# Patient Record
Sex: Female | Born: 1975 | Race: White | Hispanic: No | Smoking: Never smoker
Health system: Southern US, Community
[De-identification: ages and names within clinical notes are randomized; demographics above are authoritative.]

## PROBLEM LIST (undated history)

## (undated) DIAGNOSIS — G43909 Migraine, unspecified, not intractable, without status migrainosus: Secondary | ICD-10-CM

## (undated) DIAGNOSIS — F329 Major depressive disorder, single episode, unspecified: Secondary | ICD-10-CM

## (undated) DIAGNOSIS — K219 Gastro-esophageal reflux disease without esophagitis: Secondary | ICD-10-CM

## (undated) DIAGNOSIS — Z8041 Family history of malignant neoplasm of ovary: Secondary | ICD-10-CM

## (undated) DIAGNOSIS — E785 Hyperlipidemia, unspecified: Secondary | ICD-10-CM

## (undated) DIAGNOSIS — F419 Anxiety disorder, unspecified: Secondary | ICD-10-CM

## (undated) DIAGNOSIS — F32A Depression, unspecified: Secondary | ICD-10-CM

## (undated) DIAGNOSIS — R87612 Low grade squamous intraepithelial lesion on cytologic smear of cervix (LGSIL): Secondary | ICD-10-CM

## (undated) DIAGNOSIS — K469 Unspecified abdominal hernia without obstruction or gangrene: Secondary | ICD-10-CM

## (undated) DIAGNOSIS — K5900 Constipation, unspecified: Secondary | ICD-10-CM

## (undated) DIAGNOSIS — Z1379 Encounter for other screening for genetic and chromosomal anomalies: Secondary | ICD-10-CM

## (undated) DIAGNOSIS — K449 Diaphragmatic hernia without obstruction or gangrene: Secondary | ICD-10-CM

## (undated) DIAGNOSIS — Z9289 Personal history of other medical treatment: Secondary | ICD-10-CM

## (undated) HISTORY — DX: Family history of malignant neoplasm of ovary: Z80.41

## (undated) HISTORY — DX: Major depressive disorder, single episode, unspecified: F32.9

## (undated) HISTORY — PX: TONSILLECTOMY AND ADENOIDECTOMY: SUR1326

## (undated) HISTORY — DX: Depression, unspecified: F32.A

## (undated) HISTORY — DX: Unspecified abdominal hernia without obstruction or gangrene: K46.9

## (undated) HISTORY — PX: TUBAL LIGATION: SHX77

## (undated) HISTORY — DX: Hyperlipidemia, unspecified: E78.5

## (undated) HISTORY — DX: Constipation, unspecified: K59.00

## (undated) HISTORY — DX: Encounter for other screening for genetic and chromosomal anomalies: Z13.79

## (undated) HISTORY — DX: Personal history of other medical treatment: Z92.89

## (undated) HISTORY — DX: Migraine, unspecified, not intractable, without status migrainosus: G43.909

## (undated) HISTORY — DX: Gastro-esophageal reflux disease without esophagitis: K21.9

## (undated) HISTORY — PX: KNEE ARTHROSCOPY: SUR90

## (undated) HISTORY — DX: Diaphragmatic hernia without obstruction or gangrene: K44.9

---
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
## 2002-06-03 HISTORY — PX: COLPOSCOPY: SHX161

## 2005-01-20 ENCOUNTER — Emergency Department: Payer: Self-pay | Admitting: Internal Medicine

## 2005-01-22 ENCOUNTER — Ambulatory Visit: Payer: Self-pay | Admitting: Obstetrics & Gynecology

## 2006-03-08 ENCOUNTER — Inpatient Hospital Stay: Payer: Self-pay

## 2007-09-29 ENCOUNTER — Ambulatory Visit: Payer: Self-pay | Admitting: Internal Medicine

## 2008-07-12 ENCOUNTER — Ambulatory Visit: Payer: Self-pay | Admitting: Internal Medicine

## 2009-08-27 ENCOUNTER — Ambulatory Visit: Payer: Self-pay | Admitting: Internal Medicine

## 2010-07-17 ENCOUNTER — Ambulatory Visit: Payer: Self-pay | Admitting: Internal Medicine

## 2011-10-29 ENCOUNTER — Observation Stay: Payer: Self-pay

## 2011-11-05 ENCOUNTER — Observation Stay: Payer: Self-pay | Admitting: Obstetrics & Gynecology

## 2011-11-12 ENCOUNTER — Inpatient Hospital Stay: Payer: Self-pay | Admitting: Obstetrics & Gynecology

## 2011-11-12 LAB — CBC WITH DIFFERENTIAL/PLATELET
Basophil #: 0 10*3/uL (ref 0.0–0.1)
HGB: 10.8 g/dL — ABNORMAL LOW (ref 12.0–16.0)
Lymphocyte %: 10.1 %
MCH: 30.1 pg (ref 26.0–34.0)
MCHC: 34.4 g/dL (ref 32.0–36.0)
Monocyte #: 0.7 x10 3/mm (ref 0.2–0.9)
Neutrophil #: 8.6 10*3/uL — ABNORMAL HIGH (ref 1.4–6.5)
Neutrophil %: 82.5 %
Platelet: 189 10*3/uL (ref 150–440)
RBC: 3.57 10*6/uL — ABNORMAL LOW (ref 3.80–5.20)
WBC: 10.4 10*3/uL (ref 3.6–11.0)

## 2011-11-14 LAB — PATHOLOGY REPORT

## 2012-04-21 DIAGNOSIS — Z8041 Family history of malignant neoplasm of ovary: Secondary | ICD-10-CM

## 2012-04-21 DIAGNOSIS — E785 Hyperlipidemia, unspecified: Secondary | ICD-10-CM

## 2012-04-21 DIAGNOSIS — Z9289 Personal history of other medical treatment: Secondary | ICD-10-CM

## 2012-04-21 HISTORY — DX: Family history of malignant neoplasm of ovary: Z80.41

## 2012-04-21 HISTORY — DX: Personal history of other medical treatment: Z92.89

## 2012-04-21 HISTORY — DX: Hyperlipidemia, unspecified: E78.5

## 2012-09-10 ENCOUNTER — Ambulatory Visit: Payer: Self-pay

## 2013-02-22 ENCOUNTER — Emergency Department: Payer: Self-pay | Admitting: Emergency Medicine

## 2013-02-22 LAB — URINALYSIS, COMPLETE
Bacteria: NONE SEEN
Glucose,UR: NEGATIVE mg/dL (ref 0–75)
Hyaline Cast: 9
Nitrite: NEGATIVE
Protein: NEGATIVE
RBC,UR: <1 /HPF (ref 0–5)
Specific Gravity: 1.015 (ref 1.003–1.030)
Squamous Epithelial: 1

## 2013-02-22 LAB — CBC
HCT: 41.2 % (ref 35.0–47.0)
MCH: 28.2 pg (ref 26.0–34.0)
MCHC: 33.6 g/dL (ref 32.0–36.0)
MCV: 84 fL (ref 80–100)
Platelet: 303 10*3/uL (ref 150–440)
RBC: 4.9 10*6/uL (ref 3.80–5.20)
RDW: 13.9 % (ref 11.5–14.5)

## 2013-02-22 LAB — COMPREHENSIVE METABOLIC PANEL
Alkaline Phosphatase: 79 U/L (ref 50–136)
Anion Gap: 9 (ref 7–16)
Bilirubin,Total: 0.2 mg/dL (ref 0.2–1.0)
Calcium, Total: 7.9 mg/dL — ABNORMAL LOW (ref 8.5–10.1)
Chloride: 108 mmol/L — ABNORMAL HIGH (ref 98–107)
Co2: 20 mmol/L — ABNORMAL LOW (ref 21–32)
Creatinine: 0.88 mg/dL (ref 0.60–1.30)
EGFR (African American): >60
SGPT (ALT): 22 U/L (ref 12–78)
Sodium: 137 mmol/L (ref 136–145)
Total Protein: 6.6 g/dL (ref 6.4–8.2)

## 2013-02-22 LAB — TROPONIN I: Troponin-I: <0.02 ng/mL

## 2013-03-12 ENCOUNTER — Ambulatory Visit: Payer: Self-pay | Admitting: Podiatry

## 2013-05-05 ENCOUNTER — Ambulatory Visit: Payer: Self-pay | Admitting: Obstetrics and Gynecology

## 2013-05-05 LAB — BASIC METABOLIC PANEL
Anion Gap: 8 (ref 7–16)
BUN: 9 mg/dL (ref 7–18)
Calcium, Total: 8.9 mg/dL (ref 8.5–10.1)
Creatinine: 0.59 mg/dL — ABNORMAL LOW (ref 0.60–1.30)
EGFR (African American): >60
EGFR (Non-African Amer.): >60
Osmolality: 269 (ref 275–301)

## 2013-05-05 LAB — CBC
MCH: 27.9 pg (ref 26.0–34.0)
MCHC: 33.5 g/dL (ref 32.0–36.0)
MCV: 83 fL (ref 80–100)
Platelet: 319 10*3/uL (ref 150–440)
RDW: 13.3 % (ref 11.5–14.5)
WBC: 9.7 10*3/uL (ref 3.6–11.0)

## 2013-05-13 ENCOUNTER — Ambulatory Visit: Payer: Self-pay | Admitting: Obstetrics & Gynecology

## 2013-05-14 HISTORY — PX: ABDOMINAL HYSTERECTOMY: SHX81

## 2013-05-14 LAB — CBC WITH DIFFERENTIAL/PLATELET
Basophil %: 0.2 %
Eosinophil #: 0.1 10*3/uL (ref 0.0–0.7)
HCT: 30 % — ABNORMAL LOW (ref 35.0–47.0)
Lymphocyte #: 1.2 10*3/uL (ref 1.0–3.6)
MCH: 27.7 pg (ref 26.0–34.0)
MCHC: 33.2 g/dL (ref 32.0–36.0)
Monocyte %: 6.8 %
Neutrophil #: 7.8 10*3/uL — ABNORMAL HIGH (ref 1.4–6.5)
Neutrophil %: 80.1 %
Platelet: 243 10*3/uL (ref 150–440)
RBC: 3.61 10*6/uL — ABNORMAL LOW (ref 3.80–5.20)

## 2013-05-14 LAB — BASIC METABOLIC PANEL
Calcium, Total: 8 mg/dL — ABNORMAL LOW (ref 8.5–10.1)
Chloride: 106 mmol/L (ref 98–107)
Creatinine: 0.71 mg/dL (ref 0.60–1.30)
EGFR (African American): >60
Glucose: 114 mg/dL — ABNORMAL HIGH (ref 65–99)
Osmolality: 270 (ref 275–301)
Potassium: 3.4 mmol/L — ABNORMAL LOW (ref 3.5–5.1)
Sodium: 136 mmol/L (ref 136–145)

## 2013-12-13 ENCOUNTER — Ambulatory Visit: Payer: Self-pay | Admitting: Physician Assistant

## 2014-03-21 ENCOUNTER — Ambulatory Visit: Payer: Self-pay | Admitting: Physician Assistant

## 2014-03-21 LAB — URINALYSIS, COMPLETE
Bilirubin,UR: NEGATIVE
Blood: NEGATIVE
Glucose,UR: NEGATIVE
Ketone: NEGATIVE
Leukocyte Esterase: NEGATIVE
Nitrite: NEGATIVE
PH: 5.5 (ref 5.0–8.0)
Protein: NEGATIVE
Specific Gravity: 1.02 (ref 1.000–1.030)

## 2014-03-23 LAB — URINE CULTURE

## 2014-06-27 ENCOUNTER — Ambulatory Visit: Payer: Self-pay | Admitting: Family Medicine

## 2014-07-08 ENCOUNTER — Ambulatory Visit: Payer: Self-pay | Admitting: Registered Nurse

## 2014-07-08 LAB — RAPID STREP-A WITH REFLX: MICRO TEXT REPORT: NEGATIVE

## 2014-07-11 LAB — BETA STREP CULTURE(ARMC)

## 2014-07-20 ENCOUNTER — Ambulatory Visit: Payer: Self-pay | Admitting: Family Medicine

## 2014-08-10 ENCOUNTER — Ambulatory Visit: Payer: Self-pay | Admitting: Family Medicine

## 2014-08-12 ENCOUNTER — Ambulatory Visit: Payer: Self-pay | Admitting: Family Medicine

## 2014-08-15 DIAGNOSIS — K219 Gastro-esophageal reflux disease without esophagitis: Secondary | ICD-10-CM

## 2014-08-15 DIAGNOSIS — K449 Diaphragmatic hernia without obstruction or gangrene: Secondary | ICD-10-CM

## 2014-08-15 DIAGNOSIS — K5900 Constipation, unspecified: Secondary | ICD-10-CM

## 2014-08-15 HISTORY — DX: Diaphragmatic hernia without obstruction or gangrene: K44.9

## 2014-08-15 HISTORY — DX: Constipation, unspecified: K59.00

## 2014-08-15 HISTORY — DX: Gastro-esophageal reflux disease without esophagitis: K21.9

## 2014-09-23 NOTE — Op Note (Signed)
PATIENT NAME:  Kim Schmidt, BUELNA MR#:  [id number] DATE OF BIRTH:  1975-08-17  DATE OF PROCEDURE:  05/14/2013  PREOPERATIVE DIAGNOSIS:  Menorrhagia and severe dysmenorrhea failing medical management.   POSTOPERATIVE DIAGNOSIS:  Menorrhagia and severe dysmenorrhea failing medical management with probable endometriosis as well as a small mucinous excrescence noted on the right ovary.   OPERATION PERFORMED:  Total laparoscopic hysterectomy, left salpingectomy and right salpingo-oophorectomy as well as cystoscopy.   ANESTHESIA USED:  General.   PRIMARY SURGEON:  Stoney Bang. Georgianne Fick, MD.  ASSISTANT:  Prentice Docker, MD.  PREOPERATIVE ANTIBIOTICS:  Two grams of Ancef.  DRAINS OR TUBES:  Foley to gravity drainage.   IMPLANTS:  None.   ESTIMATED BLOOD LOSS:  150 mL.   OPERATIVE FLUIDS: 1200 mL.   URINE OUTPUT:  481 mL   COMPLICATIONS:  None.   INTRAOPERATIVE FINDINGS:  Globular, slightly enlarged uterus with a normal left tube and the ovary. The right tube appeared normal. The right ovary on closer inspection had several small, clear mucinous blebs or excrescences. Some small endometriotic implants were noted in the anterior cul-de-sac on the bladder. Cystoscopy at the conclusion of the case revealed intact bladder dome and bilateral efflux of urine from both ureters.   SPECIMENS REMOVED:  Uterus and cervix, right and left tube as well as right ovary.   PATIENT'S CONDITION FOLLOWING THE PROCEDURE:  Stable.   PROCEDURE IN DETAIL:  The risks, benefits and alternatives of the procedure were discussed with the patient prior to proceeding to the operating room. The patient was taken to the operating room where she was placed under general endotracheal anesthesia. She was positioned in the dorsal lithotomy position utilizing Allen stirrups. A time-out procedure was performed. Attention was turned to the patient's pelvis. A Foley catheter was placed. The operative speculum was then used to  visualize the anterior lip of the cervix, which was grasped with a single-tooth tenaculum. The uterus was sounded to a 10 cm. A large V-Care device was then placed, and the single-tooth tenaculum and an operative speculum were removed. Once the cup of the V-Care device was seated properly, attention was turned to the patient's abdomen. The umbilicus was infiltrated with 0.5% Sensorcaine. A stab incision was made at the base of the umbilicus, and a 5-mm XL trocar was utilized to gain entry into the peritoneal cavity under direct visualization. Pneumoperitoneum was established and 2 lateral 5-mm assistant ports were also placed under direct visualization. Following this, the general survey was conducted with normal findings as noted above other than the right ovary. The left adnexal region was inspected and the left fallopian tube was grasped and then transected off its attachment using a 5-mm LigaSure device. The utero-ovarian ligament was transected also using the 5-mm LigaSure, as was the round ligament. Once these pedicles had been transected, the anterior leaf of the broad was dissected to the level of the internal cervical os, and the bladder flap was created. The posterior leaf of the broad was likewise dissected, and the uterine artery was then skeletonized before being cauterized using a bipolar cautery. The artery was then transected using the 5-mm LigaSure device. There was some bleeding from the left uterine artery, which had to be controlled using the cautery following transection. Attention was then turned to the patient's right adnexa. 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 The ovary was therefore transected from the IP pedicle. The ureter was visualized  well away from the area of dissection. There was also some increased vascularity noted to the right ovary as opposed to the left. The ovary and fallopian tube  were dissected off the mesosalpinx. The round ligament was then incised. The anterior leaf of the broad was taken down to the level of the internal cervical os meeting the previously created bladder flap. The bladder was further mobilized off the specimen. The posterior leaf of the broad was dissected and the uterine artery was skeletonized before being cauterized with the cautery and transected similar to the left uterine artery. Attention was then turned again to the patient's left. The bladder was noted to be sufficiently off the cervix, and the V-Care device was clearly visualized through the vaginal fornix. A colpotomy incision was made and carried around circumferentially, staying within the cup of the V-Care device. Following this, the specimen was freed from all attachments and removed through the patient's vagina. The pedicles were inspected and noted to be hemostatic. The vaginal cuff was then closed vaginally using interrupted figure-of-eight's of 0 Vicryl. Following closure of the vaginal cuff, cystoscopy was performed noting bilateral efflux of urine from both ureters and an intact bladder dome. Attention was once again turned to the patient's abdomen. The pedicles were inspected one last time. The pelvis was irrigated. All pedicles were noted to be hemostatic and 1 gram of Arista powder was applied to the vaginal cuff and all pedicles. The pneumoperitoneum was evacuated. The trocars were removed. The trocar sites were dressed with Dermabond. Sponge and instrument counts were correct x 2. The patient tolerated the procedure well and was taken to the recovery room in stable condition.   ____________________________ Stoney Bang. Georgianne Fick, MD ams:jm D: 05/15/2013 10:46:36 ET T: 05/15/2013 11:24:52 ET JOB#: [id number]  cc: Stoney Bang. Georgianne Fick, MD, <Dictator> Dorthula Nettles MD ELECTRONICALLY SIGNED 05/23/2013 19:04

## 2014-09-25 NOTE — Op Note (Signed)
PATIENT NAME:  Kim Schmidt, Kim Schmidt MR#:  [id number] DATE OF BIRTH:  May 15, 1976  DATE OF PROCEDURE:  11/12/2011  PREOPERATIVE DIAGNOSIS: Intrauterine pregnancy, [redacted] weeks gestation, labor, breech and transverse lie presentation.  Desires sterilization.  POSTOPERATIVE DIAGNOSIS:  Intrauterine pregnancy, [redacted] weeks gestation, labor, breech and transverse lie presentation.  Desires sterilization.  PROCEDURE PERFORMED: Low transverse cesarean section, bilateral partial salpingectomy.   SURGEON: Wonda Cheng. Laurey Morale, M.D.   FIRST ASSISTANT: Debroah Loop   NEONATOLOGIST:    OPERATIVE FINDINGS: Baby A delivered at 11:33 a.m. Female- Terrence Dupont. 5 pounds 13 ounces, Apgars 9 and 9. Baby B delivered at 11:34 a.m. Female infant- Kayle, Apgars 9 and 9, weighing 5 pounds 1 ounce.    OPERATION:  After adequate conduction anesthesia, the patient was prepped and draped in routine fashion. A skin incision in modified Pfannenstiel fashion was made and carried down the various layers and the peritoneal cavity was entered. Bladder flap was created and the bladder was pushed down. A low transverse incision was made and the first baby as a frank breech presentation was delivered without difficulty. By the time we turned our attention to the second baby, it was now in vertex presentation. This baby was likewise delivered without difficulty. The placenta was removed manually. Cervix was "cracked". The uterus was then closed in a continuous lock suture of chromic one. All areas of surgery were inspected and found to be hemostatic. A bilateral partial salpingectomy was then performed with both tubes being doubly ligated with plain suture and a portion removed. The pelvis was lavaged with copious amounts of saline. Interceed was placed. Rectus muscles were reapproximated in the midline. The On-Q pump was placed. The fascia was reapproximated with continuous suture of Maxon. Several triple 0 plain sutures were placed in the fat and the skin  was closed with skin staples. Estimated blood loss was 800 mL.    The patient tolerated the procedure well and left the operating room in good condition. Sponge and needle counts were said to be correct at the end of the procedure.    ____________________________ Wonda Cheng. Laurey Morale, MD pjr:bjt D: 11/12/2011 12:12:42 ET T: 11/12/2011 12:43:10 ET JOB#: [id number]  cc: Wonda Cheng. Laurey Morale, MD, <Dictator> Rosina Lowenstein MD ELECTRONICALLY SIGNED 11/20/2011 6:11

## 2014-10-11 NOTE — H&P (Signed)
L&D Evaluation:  History:   HPI 39 year old G3 P73 with di/di  and EDC=12/09/2011 by LMP=03/04/2011 presented at 39 1/7 weeks for a NST with c/o contractions since 1 AM this Am that have gotten preogressively more uncomfortable. She had brown mucoid discharge x 3 days. Dates confirmed with first trimeester ultrasound. There has been normal growth of the twins, but they have had unstable lies. Currently Twin A is breech and twin B is transverse. prenatal care is also remarkable for AMA, migraines, anxiety, obesity, acute bronchitisand anemia. There was some mild renal pelvis dilation on Twin A which is WNL. Labs: A POS, VI, RI,    Presents with contractions, and for a NST    Patient's Medical History Migraines, anxiety    Patient's Surgical History arthroscopic knee surgery; T&A    Medications Pre Natal Vitamins  Zoloft 50 mgm daily    Allergies Sulfa    Social History none    Family History Non-Contributory   ROS:   ROS see HPI   Exam:   Vital Signs stable    Urine Protein not completed    General breathing thru some contractions    Mental Status clear    Chest clear    Heart normal sinus rhythm, no murmur/gallop/rubs    Abdomen gravid, tender with contractions    Fetal Position breech/transverse    Edema trace edema    Reflexes 1+    Pelvic no external lesions, 5-6/60%/OOP    Mebranes Intact    FHT normal rate with no decels, Both FHR patterns are reactive    FHT Description moderate variability    Ucx q6-7 min    Skin dry   Impression:   Impression Twin IUP in labor with breech/transverse presentation   Plan:   Plan Prepare for C-section. Dr Laurey Morale notified.   Electronic Signatures: Kim Schmidt (CNM)  (Signed 11-Jun-13 11:54)  Authored: L&D Evaluation   Last Updated: 11-Jun-13 11:54 by Kim Schmidt (CNM)

## 2015-10-04 ENCOUNTER — Ambulatory Visit
Admission: EM | Admit: 2015-10-04 | Discharge: 2015-10-04 | Disposition: A | Discharge status: Home / Self Care | Payer: BLUE CROSS/BLUE SHIELD | Attending: Family Medicine | Admitting: Family Medicine

## 2015-10-04 DIAGNOSIS — N390 Urinary tract infection, site not specified: Secondary | ICD-10-CM | POA: Diagnosis not present

## 2015-10-04 HISTORY — DX: Anxiety disorder, unspecified: F41.9

## 2015-10-04 LAB — URINALYSIS COMPLETE WITH MICROSCOPIC (ARMC ONLY)
Bilirubin Urine: NEGATIVE
Glucose, UA: NEGATIVE mg/dL
KETONES UR: NEGATIVE mg/dL
Nitrite: NEGATIVE
PH: 6 (ref 5.0–8.0)
PROTEIN: 30 mg/dL — AB
SPECIFIC GRAVITY, URINE: 1.02 (ref 1.005–1.030)

## 2015-10-04 MED ORDER — SULFAMETHOXAZOLE-TRIMETHOPRIM 800-160 MG PO TABS: 1.0000 | ORAL_TABLET | Freq: Two times a day (BID) | ORAL | Status: DC

## 2015-10-04 MED ORDER — PHENAZOPYRIDINE HCL 200 MG PO TABS: 200.0000 mg | ORAL_TABLET | Freq: Three times a day (TID) | ORAL | Status: DC

## 2015-10-04 NOTE — ED Provider Notes (Signed)
CSN: [id number]     Arrival date & time 10/04/15  1540 History   First MD Initiated Contact with Patient 10/04/15 1622     Chief Complaint  Patient presents with  . Urinary Frequency    Pt started yesterday with UTI sx of burning with urination, frequency, blood in urine and pelvic discomfort 3/10   (Consider location/radiation/quality/duration/timing/severity/associated sxs/prior Treatment) HPI Comments: Married caucasian female with burning, frequency and gross hematuria that started yesterday.  Last UTI one year ago was initially on cipro and had to take bactrim as resistant to cipro.  Denied side effects to either antibiotic.  +Headache, pelvic pain had PSHx c-section, hysterectomy no menses for years or vaginal bleeding denied new sexual partners.    The history is provided by the patient.    Past Medical History  Diagnosis Date  . Anxiety    Past Surgical History  Procedure Laterality Date  . Cesarean section    . Abdominal hysterectomy     History reviewed. No pertinent family history. Social History  Substance Use Topics  . Smoking status: Never Smoker   . Smokeless tobacco: None  . Alcohol Use: No   OB History    No data available     Review of Systems  Constitutional: Negative for fever, chills, diaphoresis, activity change, appetite change, fatigue and unexpected weight change.  HENT: Negative for congestion, dental problem, drooling, ear discharge, ear pain, facial swelling, hearing loss, mouth sores, nosebleeds, postnasal drip, rhinorrhea, sinus pressure, sneezing, sore throat, tinnitus, trouble swallowing and voice change.   Eyes: Negative for photophobia, pain, discharge, redness, itching and visual disturbance.  Respiratory: Negative for cough, choking, chest tightness, shortness of breath, wheezing and stridor.   Cardiovascular: Negative for chest pain, palpitations and leg swelling.  Gastrointestinal: Negative for nausea, vomiting, abdominal pain, diarrhea,  constipation, blood in stool and abdominal distention.  Endocrine: Negative for cold intolerance and heat intolerance.  Genitourinary: Positive for dysuria, urgency, frequency, hematuria and pelvic pain. Negative for flank pain, decreased urine volume, vaginal bleeding, enuresis, difficulty urinating, genital sores and menstrual problem.  Musculoskeletal: Negative for myalgias, back pain, joint swelling, arthralgias, gait problem, neck pain and neck stiffness.  Skin: Negative for color change, pallor, rash and wound.  Allergic/Immunologic: Negative for environmental allergies and food allergies.  Neurological: Positive for headaches. Negative for dizziness, tremors, seizures, syncope, facial asymmetry, speech difficulty, weakness, light-headedness and numbness.  Hematological: Negative for adenopathy. Does not bruise/bleed easily.  Psychiatric/Behavioral: Negative for behavioral problems, confusion, sleep disturbance and agitation.    Allergies  Review of patient's allergies indicates no known allergies.  Home Medications   Prior to Admission medications   Medication Sig Start Date End Date Taking? Authorizing Provider  sertraline (ZOLOFT) 50 MG tablet Take 50 mg by mouth daily.   Yes Historical Provider, MD  phenazopyridine (PYRIDIUM) 200 MG tablet Take 1 tablet (200 mg total) by mouth 3 (three) times daily. 10/04/15   Olen Cordial, NP  sulfamethoxazole-trimethoprim (BACTRIM DS,SEPTRA DS) 800-160 MG tablet Take 1 tablet by mouth 2 (two) times daily. 10/04/15   Olen Cordial, NP   Meds Ordered and Administered this Visit  Medications - No data to display  BP 122/67 mmHg  Pulse 83  Temp(Src) 98.5 F (36.9 C) (Oral)  Resp 18  Ht 5\' 4"  (1.626 m)  Wt 230 lb (104.327 kg)  BMI 39.46 kg/m2  SpO2 100% No data found.   Physical Exam  Constitutional: She is oriented to person, place, and time.  She appears well-developed and well-nourished. She is active and cooperative.  Non-toxic  appearance. She does not have a sickly appearance. She does not appear ill. No distress.  HENT:  Head: Normocephalic and atraumatic.  Right Ear: Hearing, tympanic membrane, external ear and ear canal normal.  Left Ear: Hearing, tympanic membrane, external ear and ear canal normal.  Nose: No mucosal edema, rhinorrhea, nose lacerations, sinus tenderness, nasal deformity, septal deviation or nasal septal hematoma. No epistaxis.  No foreign bodies. Right sinus exhibits no maxillary sinus tenderness and no frontal sinus tenderness. Left sinus exhibits no maxillary sinus tenderness and no frontal sinus tenderness.  Mouth/Throat: Uvula is midline and mucous membranes are normal. Mucous membranes are not pale, not dry and not cyanotic. She does not have dentures. No oral lesions. No trismus in the jaw. Normal dentition. No dental abscesses, uvula swelling, lacerations or dental caries. No oropharyngeal exudate, posterior oropharyngeal edema, posterior oropharyngeal erythema or tonsillar abscesses.  Eyes: Conjunctivae, EOM and lids are normal. Pupils are equal, round, and reactive to light. Right eye exhibits no chemosis, no discharge, no exudate and no hordeolum. No foreign body present in the right eye. Left eye exhibits no chemosis, no discharge, no exudate and no hordeolum. No foreign body present in the left eye. Right conjunctiva is not injected. Right conjunctiva has no hemorrhage. Left conjunctiva is not injected. Left conjunctiva has no hemorrhage. No scleral icterus. Right eye exhibits normal extraocular motion and no nystagmus. Left eye exhibits normal extraocular motion and no nystagmus. Right pupil is round and reactive. Left pupil is round and reactive. Pupils are equal.  Neck: Trachea normal and normal range of motion. Neck supple. No tracheal tenderness, no spinous process tenderness and no muscular tenderness present. No rigidity. No tracheal deviation, no edema, no erythema and normal range of  motion present. No thyroid mass and no thyromegaly present.  Cardiovascular: Normal rate, regular rhythm, S1 normal, S2 normal, normal heart sounds and intact distal pulses.  PMI is not displaced.  Exam reveals no gallop and no friction rub.   No murmur heard. Pulmonary/Chest: Effort normal and breath sounds normal. No accessory muscle usage or stridor. No respiratory distress. She has no decreased breath sounds. She has no wheezes. She has no rhonchi. She has no rales. She exhibits no tenderness.  Abdominal: Soft. Bowel sounds are normal. She exhibits no shifting dullness, no distension, no pulsatile liver, no fluid wave, no abdominal bruit, no ascites, no pulsatile midline mass and no mass. There is no hepatosplenomegaly. There is no tenderness. There is no rigidity, no rebound, no guarding, no CVA tenderness, no tenderness at McBurney's point and negative Murphy's sign. Hernia confirmed negative in the ventral area.  Dull to percussion x 4 quads  Musculoskeletal: Normal range of motion. She exhibits no edema or tenderness.       Right shoulder: Normal.       Left shoulder: Normal.       Right hip: Normal.       Left hip: Normal.       Right knee: Normal.       Left knee: Normal.       Cervical back: Normal.       Right hand: Normal.       Left hand: Normal.  Lymphadenopathy:       Head (right side): No submental, no submandibular, no tonsillar, no preauricular, no posterior auricular and no occipital adenopathy present.       Head (left side): No submental,  no submandibular, no tonsillar, no preauricular, no posterior auricular and no occipital adenopathy present.    She has no cervical adenopathy.       Right cervical: No superficial cervical, no deep cervical and no posterior cervical adenopathy present.      Left cervical: No superficial cervical, no deep cervical and no posterior cervical adenopathy present.  Neurological: She is alert and oriented to person, place, and time. She has  normal strength. She is not disoriented. She displays no atrophy and no tremor. No cranial nerve deficit or sensory deficit. She exhibits normal muscle tone. She displays no seizure activity. Coordination and gait normal. GCS eye subscore is 4. GCS verbal subscore is 5. GCS motor subscore is 6.  Skin: Skin is warm, dry and intact. No abrasion, no bruising, no burn, no ecchymosis, no laceration, no lesion, no petechiae and no rash noted. She is not diaphoretic. No cyanosis or erythema. No pallor. Nails show no clubbing.  Psychiatric: She has a normal mood and affect. Her speech is normal and behavior is normal. Judgment and thought content normal. Cognition and memory are normal.  Nursing note and vitals reviewed.   ED Course  Procedures (including critical care time)  Labs Review Labs Reviewed  URINALYSIS COMPLETEWITH MICROSCOPIC ([facility] ONLY) - Abnormal; Notable for the following:    APPearance CLOUDY (*)    Hgb urine dipstick 2+ (*)    Protein, ur 30 (*)    Leukocytes, UA 1+ (*)    Bacteria, UA FEW (*)    Squamous Epithelial / LPF 0-5 (*)    All other components within normal limits  URINE CULTURE    Imaging Review No results found.  1630 discussed urinalysis results with patient + protein, blood, bacteria, cloudy copy of urinalysis results given to patient and instructed to follow up with PCM in 3 weeks for repeat urinalysis to ensure blood and protein resolved with UTI treatment.  Patient verbalized understanding of information/instructions, agreed with plan of care and had no further questions at this time.  MDM   1. UTI (lower urinary tract infection)    Medications as directed.  Patient reported UTI one year ago resistant to cipro and had to take bactrim to clear infection.  Patient denied side effects to bactrim at that time.  Bactrim DS po BID x 7 days Rx given. Patient is also to push fluids and may use Pyridium 200mg  po TID as needed x 2 days discussed with turn urine orange  and stain clothes.  Hydrate, avoid dehydration.  Avoid holding urine void on frequent basis every 4 to 6 hours.  If unable to void every 8 hours, worsening hematuria follow up for re-evaluation with PCM, urgent care or ER.   Call or return to clinic as needed if these symptoms worsen or fail to improve as anticipated.  Exitcare handout on UTI given to patient Patient verbalized agreement and understanding of treatment plan and had no further questions at this time. P2:  Hydrate and cranberry juice    Olen Cordial, NP 10/04/15 2124

## 2015-10-04 NOTE — Discharge Instructions (Signed)
Hematuria, Adult °Hematuria is blood in your urine. It can be caused by a bladder infection, kidney infection, prostate infection, kidney stone, or cancer of your urinary tract. Infections can usually be treated with medicine, and a kidney stone usually will pass through your urine. If neither of these is the cause of your hematuria, further workup to find out the reason may be needed. °It is very important that you tell your health care provider about any blood you see in your urine, even if the blood stops without treatment or happens without causing pain. Blood in your urine that happens and then stops and then happens again can be a symptom of a very serious condition. Also, pain is not a symptom in the initial stages of many urinary cancers. °HOME CARE INSTRUCTIONS  °· Drink lots of fluid, 3-4 quarts a day. If you have been diagnosed with an infection, cranberry juice is especially recommended, in addition to large amounts of water. °· Avoid caffeine, tea, and carbonated beverages because they tend to irritate the bladder. °· Avoid alcohol because it may irritate the prostate. °· Take all medicines as directed by your health care provider. °· If you were prescribed an antibiotic medicine, finish it all even if you start to feel better. °· If you have been diagnosed with a kidney stone, follow your health care provider's instructions regarding straining your urine to catch the stone. °· Empty your bladder often. Avoid holding urine for long periods of time. °· After a bowel movement, women should cleanse front to back. Use each tissue only once. °· Empty your bladder before and after sexual intercourse if you are a female. °SEEK MEDICAL CARE IF: °· You develop back pain. °· You have a fever. °· You have a feeling of sickness in your stomach (nausea) or vomiting. °· Your symptoms are not better in 3 days. Return sooner if you are getting worse. °SEEK IMMEDIATE MEDICAL CARE IF:  °· You develop severe vomiting and  are unable to keep the medicine down. °· You develop severe back or abdominal pain despite taking your medicines. °· You begin passing a large amount of blood or clots in your urine. °· You feel extremely weak or faint, or you pass out. °MAKE SURE YOU:  °· Understand these instructions. °· Will watch your condition. °· Will get help right away if you are not doing well or get worse. °  °This information is not intended to replace advice given to you by your health care provider. Make sure you discuss any questions you have with your health care provider. °  °Document Released: 05/20/2005 Document Revised: 06/10/2014 Document Reviewed: 01/18/2013 °Elsevier Interactive Patient Education ©2016 [employer] °Proteinuria °Proteinuria is a condition in which urine contains more protein than is normal. Proteinuria is either a sign that your body is producing too much protein or a sign that there is a problem with the kidneys. Healthy kidneys prevent most substances that the body needs, including proteins, from leaving the bloodstream and ending up in urine. °CAUSES  °Proteinuria may be caused by a temporary event or condition such as stress, exercise, or fever, and go away on its own. Proteinuria may also be a symptom of a more serious condition or disease. Causes of proteinuria include: °· A kidney disease caused by: °· Diabetes. °· High blood pressure (hypertension).   °· A disease that affects the immune system, such as lupus. °· A genetic disease, such as Alport's syndrome. °· Medicines that damage the kidneys, such   as long-term nonsteroidal anti-inflammatory drugs (NSAIDs).  Poisoning or exposure to toxic substances.  A reoccurring kidney or urinary infection.  Excess protein production in the body caused by:  Multiple myeloma.  Amyloidosis. SYMPTOMS You may have proteinuria without having noticeable symptoms. If there is a large amount of protein in your urine, your urine may look foamy. You may also  notice swelling (edema) in your hands, feet, abdomen, or face. DIAGNOSIS To determine whether you have proteinuria, you will need to provide a urine sample. Your urine will then be tested for too much protein and the main blood protein albumin. If your test shows that you have proteinuria, you may need to take additional tests to determine its cause, how much protein is in your urine, and what type of protein is being lost. Tests may include:  Blood tests.  Urine tests.  A blood pressure measurement.  Imaging tests. TREATMENT  Treatment will depend on the cause of your proteinuria. Your caregiver will discuss treatment options with you after you have been diagnosed. If your proteinuria is mild or temporary, no treatment may be necessary. HOME CARE INSTRUCTIONS Ask your caregiver if monitoring the level of protein in your urine at home using simple testing strips is appropriate for you. Early detection of proteinuria can lead to early and often successful treatment of the condition causing it.   This information is not intended to replace advice given to you by your health care provider. Make sure you discuss any questions you have with your health care provider.   Document Released: 07/10/2005 Document Revised: 02/12/2012 Document Reviewed: 10/18/2011 Elsevier Interactive Patient Education 2016 [employer] Inc[employer] Urinary Tract Infection Urinary tract infections (UTIs) can develop anywhere along your urinary tract. Your urinary tract is your body's drainage system for removing wastes and extra water. Your urinary tract includes two kidneys, two ureters, a bladder, and a urethra. Your kidneys are a pair of bean-shaped organs. Each kidney is about the size of your fist. They are located below your ribs, one on each side of your spine. CAUSES Infections are caused by microbes, which are microscopic organisms, including fungi, viruses, and bacteria. These organisms are so small that they can only be  seen through a microscope. Bacteria are the microbes that most commonly cause UTIs. SYMPTOMS  Symptoms of UTIs may vary by age and gender of the patient and by the location of the infection. Symptoms in young women typically include a frequent and intense urge to urinate and a painful, burning feeling in the bladder or urethra during urination. Older women and men are more likely to be tired, shaky, and weak and have muscle aches and abdominal pain. A fever may mean the infection is in your kidneys. Other symptoms of a kidney infection include pain in your back or sides below the ribs, nausea, and vomiting. DIAGNOSIS To diagnose a UTI, your caregiver will ask you about your symptoms. Your caregiver will also ask you to provide a urine sample. The urine sample will be tested for bacteria and white blood cells. White blood cells are made by your body to help fight infection. TREATMENT  Typically, UTIs can be treated with medication. Because most UTIs are caused by a bacterial infection, they usually can be treated with the use of antibiotics. The choice of antibiotic and length of treatment depend on your symptoms and the type of bacteria causing your infection. HOME CARE INSTRUCTIONS  If you were prescribed antibiotics, take them exactly as your caregiver instructs you.  Finish the medication even if you feel better after you have only taken some of the medication. °· Drink enough water and fluids to keep your urine clear or pale yellow. °· Avoid caffeine, tea, and carbonated beverages. They tend to irritate your bladder. °· Empty your bladder often. Avoid holding urine for long periods of time. °· Empty your bladder before and after sexual intercourse. °· After a bowel movement, women should cleanse from front to back. Use each tissue only once. °SEEK MEDICAL CARE IF:  °· You have back pain. °· You develop a fever. °· Your symptoms do not begin to resolve within 3 days. °SEEK IMMEDIATE MEDICAL CARE IF:   °· You have severe back pain or lower abdominal pain. °· You develop chills. °· You have nausea or vomiting. °· You have continued burning or discomfort with urination. °MAKE SURE YOU:  °· Understand these instructions. °· Will watch your condition. °· Will get help right away if you are not doing well or get worse. °  °This information is not intended to replace advice given to you by your health care provider. Make sure you discuss any questions you have with your health care provider. °  °Document Released: 02/27/2005 Document Revised: 02/08/2015 Document Reviewed: 06/28/2011 °Elsevier Interactive Patient Education ©2016 [employer] Inc. ° °

## 2015-10-07 LAB — URINE CULTURE
Culture: >=100000 — AB
Special Requests: NORMAL

## 2015-10-10 ENCOUNTER — Telehealth: Payer: Self-pay | Admitting: Obstetrics and Gynecology

## 2015-10-10 NOTE — Telephone Encounter (Signed)
Telephone message left for patient urine culture e.coli resistant to ampicillin.  Bactrim DS should have been effective but if still having symptoms recommend repeat urinalysis/evaluation.  Patient to contact clinic and ask to speak with nurse or I will be in clinic tomorrow [phone].

## 2015-10-16 ENCOUNTER — Ambulatory Visit
Admission: EM | Admit: 2015-10-16 | Discharge: 2015-10-16 | Disposition: A | Discharge status: Home / Self Care | Payer: BLUE CROSS/BLUE SHIELD | Attending: Emergency Medicine | Admitting: Emergency Medicine

## 2015-10-16 ENCOUNTER — Encounter: Payer: Self-pay | Admitting: Emergency Medicine

## 2015-10-16 DIAGNOSIS — R300 Dysuria: Secondary | ICD-10-CM

## 2015-10-16 LAB — URINALYSIS COMPLETE WITH MICROSCOPIC (ARMC ONLY)
Bacteria, UA: NONE SEEN
Bilirubin Urine: NEGATIVE
Glucose, UA: NEGATIVE mg/dL
HGB URINE DIPSTICK: NEGATIVE
KETONES UR: NEGATIVE mg/dL
LEUKOCYTES UA: NEGATIVE
NITRITE: NEGATIVE
PH: 7 (ref 5.0–8.0)
PROTEIN: NEGATIVE mg/dL
SPECIFIC GRAVITY, URINE: 1.02 (ref 1.005–1.030)

## 2015-10-16 LAB — WET PREP, GENITAL
Clue Cells Wet Prep HPF POC: NONE SEEN
Sperm: NONE SEEN
Trich, Wet Prep: NONE SEEN
YEAST WET PREP: NONE SEEN

## 2015-10-16 LAB — CHLAMYDIA/NGC RT PCR (ARMC ONLY)
CHLAMYDIA TR: NOT DETECTED
N GONORRHOEAE: NOT DETECTED

## 2015-10-16 MED ORDER — PHENAZOPYRIDINE HCL 200 MG PO TABS: 200.0000 mg | ORAL_TABLET | Freq: Three times a day (TID) | ORAL | Status: DC

## 2015-10-16 NOTE — ED Provider Notes (Signed)
HPI  SUBJECTIVE:  Kim Schmidt is a 40 y.o. female who presents with dysuria, urinary urgency, frequency, cloudy urine and  lower abdominal pain/pressure after urinating starting 6 days ago. C/o lower back pain. No aggravating or alleviating factors.  Tried 600 milligrams ibuprofen 4 times a day without improvement.  No nausea, vomiting, fever, chills. No hematuria.  No other abdominal pain, abdominal distention.  No vaginal bleeding or discharge/odor, vulvar itching, genital rash Patient is in a monoagmous sexual relationship with her husband of 15 years who is asymptomatic.  STDs  are not a concern today. Patient has taken  IBU in the past 4-6 hours.  Pt iis not pregnant- status post hysterectomy. Recently treated with Bactrim for a UTI. Also has history of hypertension, yeast infections. No history of diabetes, nephrolithiasis,  No h/o gonorrhea, chlamydia, herpes, HIV, syphilis, Trichomonas, BV. States this feels similar to previous UTIs. FH neg nephrolithiasis.  Previous records reviewed. Patient was seen here 5/3, found to have a UTI, sent home on Bactrim and Pyridium. Culture grew out Escherichia coli sensitive to Macrobid, Bactrim, Cipro, Keflex, Rocephin.  PMD: Dr. Edilia Bo at [facility]. LMP: Hysterectomy 2014.  Past Medical History  Diagnosis Date  . Anxiety     Past Surgical History  Procedure Laterality Date  . Cesarean section    . Abdominal hysterectomy      No family history on file.  Social History  Substance Use Topics  . Smoking status: Never Smoker   . Smokeless tobacco: None  . Alcohol Use: No    No current facility-administered medications for this encounter.  Current outpatient prescriptions:  .  phenazopyridine (PYRIDIUM) 200 MG tablet, Take 1 tablet (200 mg total) by mouth 3 (three) times daily., Disp: 6 tablet, Rfl: 0 .  sertraline (ZOLOFT) 50 MG tablet, Take 50 mg by mouth daily., Disp: , Rfl:   No Known Allergies   ROS  As noted in  HPI.   Physical Exam  BP 134/74 mmHg  Pulse 77  Temp(Src) 98.2 F (36.8 C) (Oral)  Resp 20  Ht 5\' 4"  (1.626 m)  Wt 230 lb (104.327 kg)  BMI 39.46 kg/m2  SpO2 100%  Constitutional: Well developed, well nourished, no acute distress Eyes:  EOMI, conjunctiva normal bilaterally HENT: Normocephalic, atraumatic,mucus membranes moist Respiratory: Normal inspiratory effort Cardiovascular: Normal rate GI: nondistended. Soft, nontender, normal bowel sounds. No suprapubic or flank tenderness Back: No CVA tenderness GU: Normal external genitalia, surgical cuff intact. Positive scant whitish vaginal discharge. No internal ulcers. No adnexal tenderness or masses. Urethra appears normal. Chaperone present during exam. skin: No rash, skin intact Musculoskeletal: no deformities Neurologic: Alert & oriented x 3, no focal neuro deficits Psychiatric: Speech and behavior appropriate   ED Course   Medications - No data to display  Orders Placed This Encounter  Procedures  . Urine culture    Standing Status: Standing     Number of Occurrences: 1     Standing Expiration Date:     Order Specific Question:  Patient immune status    Answer:  Normal  . Wet prep, genital    Standing Status: Standing     Number of Occurrences: 1     Standing Expiration Date:   . [place] rt PCR    Standing Status: Standing     Number of Occurrences: 1     Standing Expiration Date:     Order Specific Question:  Patient immune status    Answer:  Normal  .  Urinalysis complete, with microscopic    Standing Status: Standing     Number of Occurrences: 1     Standing Expiration Date:     Results for orders placed or performed during the hospital encounter of 10/16/15 (from the past 24 hour(s))  Urinalysis complete, with microscopic     Status: Abnormal   Collection Time: 10/16/15  4:44 PM  Result Value Ref Range   Color, Urine YELLOW YELLOW   APPearance CLEAR CLEAR   Glucose, UA NEGATIVE NEGATIVE mg/dL    Bilirubin Urine NEGATIVE NEGATIVE   Ketones, ur NEGATIVE NEGATIVE mg/dL   Specific Gravity, Urine 1.020 1.005 - 1.030   Hgb urine dipstick NEGATIVE NEGATIVE   pH 7.0 5.0 - 8.0   Protein, ur NEGATIVE NEGATIVE mg/dL   Nitrite NEGATIVE NEGATIVE   Leukocytes, UA NEGATIVE NEGATIVE   RBC / HPF 0-5 0 - 5 RBC/hpf   WBC, UA 0-5 0 - 5 WBC/hpf   Bacteria, UA NONE SEEN NONE SEEN   Squamous Epithelial / LPF 0-5 (A) NONE SEEN  Wet prep, genital     Status: Abnormal   Collection Time: 10/16/15  5:27 PM  Result Value Ref Range   Yeast Wet Prep HPF POC NONE SEEN NONE SEEN   Trich, Wet Prep NONE SEEN NONE SEEN   Clue Cells Wet Prep HPF POC NONE SEEN NONE SEEN   WBC, Wet Prep HPF POC FEW (A) NONE SEEN   Sperm NONE SEEN    No results found.  ED Clinical Impression  Dysuria   ED Assessment/Plan  UA today negative for UTI. No bacteria, leukocytes, WBCs, hemoglobin, proteinuria. We'll send urine off for culture to confirm absence of UTI. If culture comes back positive for UTI, we will contact her and then calling the appropriate antibiotics.  Not doing gonorrhea Chlamydia, think that patient is low risk enough that testing is not necessary. Wet prep negative for yeast, BV, Trichomonas.  Discussed labs,  MDM, plan and followup with patient  Discussed sn/sx that should prompt return to the  ED. Patient  agrees with plan.   *This clinic note was created using Dragon dictation software. Therefore, there may be occasional mistakes despite careful proofreading.  ?    Melynda Ripple, MD 10/16/15 [id number]

## 2015-10-16 NOTE — ED Notes (Signed)
Pt with burning while urinating and low back pain. Recently dx with uti and has finished all meds.

## 2015-10-16 NOTE — Discharge Instructions (Signed)
800 mg of ibuprofen with 1 g of Tylenol 3 times a day as needed for pain. The Pyridium will help with the pain as well. Increase fluids. We'll contact you if your urine culture comes back positive for urinary tract infection and we will then call in the appropriate antibiotics.

## 2015-10-18 LAB — URINE CULTURE: SPECIAL REQUESTS: NORMAL

## 2015-10-20 ENCOUNTER — Telehealth: Payer: Self-pay | Admitting: *Deleted

## 2015-10-20 NOTE — ED Notes (Signed)
Called patient and informed her that her Gonorrhea and Chlamydia test results are negative. Patient reports that she is feeling better.

## 2016-01-02 DIAGNOSIS — Z1379 Encounter for other screening for genetic and chromosomal anomalies: Secondary | ICD-10-CM

## 2016-01-02 HISTORY — DX: Encounter for other screening for genetic and chromosomal anomalies: Z13.79

## 2016-07-01 ENCOUNTER — Encounter (HOSPITAL_COMMUNITY): Payer: Self-pay

## 2016-07-04 ENCOUNTER — Ambulatory Visit: Payer: Self-pay | Admitting: Licensed Clinical Social Worker

## 2016-07-30 ENCOUNTER — Other Ambulatory Visit: Payer: Self-pay | Admitting: Obstetrics and Gynecology

## 2016-07-30 MED ORDER — BUTALBITAL-APAP-CAFFEINE 50-325-40 MG PO CAPS: 1.0000 | ORAL_CAPSULE | Freq: Four times a day (QID) | ORAL | 0 refills | Status: DC | PRN

## 2016-07-30 NOTE — Progress Notes (Unsigned)
bu

## 2016-08-17 ENCOUNTER — Other Ambulatory Visit: Payer: Self-pay | Admitting: Obstetrics and Gynecology

## 2016-08-17 NOTE — Telephone Encounter (Signed)
Pt was supposed to f/u last month for anxiety/depression but hasn't come in for appt.

## 2016-09-04 ENCOUNTER — Other Ambulatory Visit: Payer: Self-pay | Admitting: Obstetrics and Gynecology

## 2016-09-04 ENCOUNTER — Telehealth: Payer: Self-pay

## 2016-09-04 MED ORDER — PAROXETINE HCL 20 MG PO TABS: 20.0000 mg | ORAL_TABLET | Freq: Every day | ORAL | 4 refills | Status: DC

## 2016-09-04 NOTE — Telephone Encounter (Signed)
See my note for RN f/u.

## 2016-09-04 NOTE — Telephone Encounter (Signed)
RN to notify pt Rx filled till 8/18 annual. Must come in then.

## 2016-09-04 NOTE — Telephone Encounter (Signed)
Pt calling stating [facility] contacted her that paxil refill was denied, needs appt.  Pt has started a new job and is unable to come in for appt and is asking that we please refill it.  States it is working well and she has two left.  [phone]

## 2016-09-05 NOTE — Telephone Encounter (Signed)
Pt aware.

## 2016-09-16 ENCOUNTER — Ambulatory Visit: Payer: Self-pay | Admitting: Obstetrics and Gynecology

## 2016-09-19 ENCOUNTER — Ambulatory Visit (INDEPENDENT_AMBULATORY_CARE_PROVIDER_SITE_OTHER): Payer: BLUE CROSS/BLUE SHIELD | Admitting: Obstetrics and Gynecology

## 2016-09-19 ENCOUNTER — Encounter: Payer: Self-pay | Admitting: Obstetrics and Gynecology

## 2016-09-19 VITALS — BP 134/88 | HR 79 | Ht 64.0 in | Wt 241.0 lb

## 2016-09-19 DIAGNOSIS — F419 Anxiety disorder, unspecified: Secondary | ICD-10-CM

## 2016-09-19 DIAGNOSIS — F32A Depression, unspecified: Secondary | ICD-10-CM

## 2016-09-19 DIAGNOSIS — Z713 Dietary counseling and surveillance: Secondary | ICD-10-CM | POA: Diagnosis not present

## 2016-09-19 DIAGNOSIS — F329 Major depressive disorder, single episode, unspecified: Secondary | ICD-10-CM

## 2016-09-19 MED ORDER — PAROXETINE HCL 20 MG PO TABS: 20.0000 mg | ORAL_TABLET | Freq: Every day | ORAL | 4 refills | Status: DC

## 2016-09-19 MED ORDER — ALPRAZOLAM 0.25 MG PO TABS: 0.2500 mg | ORAL_TABLET | Freq: Every evening | ORAL | 0 refills | Status: DC | PRN

## 2016-09-19 MED ORDER — PHENTERMINE HCL 37.5 MG PO TABS: 37.5000 mg | ORAL_TABLET | Freq: Every day | ORAL | 0 refills | Status: DC

## 2016-09-19 NOTE — Progress Notes (Signed)
HPI:      [name] is a 41 y.o. [phone] who LMP was No LMP recorded. Patient has had a hysterectomy., presents today for anxiety/depression sx f/u. She stopped zoloft 12/17 and started paxil 1/18. She initially did 1/2 tab (10 mg dose) but then increased to 20 mg dose. She feels her anxiety, worry, numbness (with zoloft) have improved. She is possibly going to be separating from her husband which is causing some anxiety/difficulty sleeping. She takes xanax sparingly. No SI.  She is stress eating and has gained 9# since 1/18. She is interested in contrave for wt loss. She has started exercising and feels better. She is eating a lot of carbs.   Past Medical History:  Diagnosis Date  . Abdominal hernia   . Anxiety   . Constipation 08/15/2014  . Depression   . Esophageal reflux 08/15/2014  . Family history of ovarian cancer 04/21/2012   MGM; Mom is BRCA/BART neg.  . Genetic testing of female 01/2016   MYRISK neg  . Hiatal hernia 08/15/2014  . History of Papanicolaou smear of cervix 04/21/2012   -/-  . Hyperlipidemia 04/21/2012  . Migraine    menstrual    Past Surgical History:  Procedure Laterality Date  . ABDOMINAL HYSTERECTOMY  05/14/2013   [facility]; [facility]; [facility]SO;   . CESAREAN SECTION  11/12/2011   twins; [facility]  . COLPOSCOPY  2004   [facility]  . KNEE ARTHROSCOPY     RIGHT  . TONSILLECTOMY AND ADENOIDECTOMY    . TUBAL LIGATION      Family History  Problem Relation Age of Onset  . Hypertension Mother   . Cancer Father 45    MELANOMA  . Cancer Maternal Grandmother 44    OVARY  . Breast cancer Paternal Grandmother 64    BRAIN     ROS:  Review of Systems  Constitutional: Positive for malaise/fatigue. Negative for fever and weight loss.  Gastrointestinal: Negative for blood in stool, constipation, diarrhea, nausea and vomiting.  Genitourinary: Negative for dysuria, flank pain, frequency, hematuria and urgency.  Musculoskeletal: Negative for back pain.  Skin:  Negative for itching and rash.  Psychiatric/Behavioral: Positive for depression. The patient is nervous/anxious.     Objective: BP 134/88 (BP Location: Left Arm, Patient Position: Sitting, Cuff Size: Large)   Pulse 79   Ht _0  (1.626 m)   Wt 241 lb (109.3 kg)   BMI 41.37 kg/m    OBGyn Exam  NOT INDICATED  Results: GAD-7=13 (IMPROVED FROM 20) PHQ-9=6 (IMPROVED FROM 9)   Assessment/Plan: Anxiety and depression - Sx improved with paxil/prn xanax. Cont meds. Rx RF. F/u at 8/18 annual. - Plan: ALPRAZolam (XANAX) 0.25 MG tablet, PARoxetine (PAXIL) 20 MG tablet  Encounter for weight loss counseling - Diet/exercise/less than 40 g carb daily--high protein. Rx phentermine given to pt for 2 months. Question stimulant may increase anxiety or give her more energy. - Plan: phentermine (ADIPEX-P) 37.5 MG tablet  Don't want to give pt contrave currently while trying to manage anxiety/increased stressors.    Meds ordered this encounter  Medications  . ALPRAZolam (XANAX) 0.25 MG tablet    Sig: Take 1 tablet (0.25 mg total) by mouth at bedtime as needed for anxiety.    Dispense:  30 tablet    Refill:  0  . PARoxetine (PAXIL) 20 MG tablet    Sig: Take 1 tablet (20 mg total) by mouth daily.    Dispense:  30 tablet  Refill:  4  . phentermine (ADIPEX-P) 37.5 MG tablet    Sig: Take 1 tablet (37.5 mg total) by mouth daily before breakfast.    Dispense:  30 tablet    Refill:  0     F/U  Return in about 4 months (around 01/19/2017), or if symptoms worsen or fail to improve, for annual.  Kim Atha B. Camay Pedigo, PA-C 09/19/2016 5:06 PM

## 2016-11-05 ENCOUNTER — Ambulatory Visit
Admission: EM | Admit: 2016-11-05 | Discharge: 2016-11-05 | Disposition: A | Discharge status: Home / Self Care | Payer: BLUE CROSS/BLUE SHIELD | Attending: Family Medicine | Admitting: Family Medicine

## 2016-11-05 DIAGNOSIS — R109 Unspecified abdominal pain: Secondary | ICD-10-CM | POA: Diagnosis not present

## 2016-11-05 DIAGNOSIS — R300 Dysuria: Secondary | ICD-10-CM

## 2016-11-05 LAB — URINALYSIS, COMPLETE (UACMP) WITH MICROSCOPIC
BACTERIA UA: NONE SEEN
Bilirubin Urine: NEGATIVE
Glucose, UA: NEGATIVE mg/dL
Hgb urine dipstick: NEGATIVE
KETONES UR: NEGATIVE mg/dL
LEUKOCYTES UA: NEGATIVE
Nitrite: NEGATIVE
PH: 5.5 (ref 5.0–8.0)
Protein, ur: NEGATIVE mg/dL
RBC / HPF: NONE SEEN RBC/hpf (ref 0–5)
SPECIFIC GRAVITY, URINE: 1.02 (ref 1.005–1.030)

## 2016-11-05 MED ORDER — CYCLOBENZAPRINE HCL 10 MG PO TABS: 10.0000 mg | ORAL_TABLET | Freq: Two times a day (BID) | ORAL | 0 refills | Status: DC | PRN

## 2016-11-05 NOTE — ED Provider Notes (Signed)
[facility] ____________________________________________  Time seen: Approximately 7:20 PM  I have reviewed the triage vital signs and the nursing notes.   HISTORY  Chief Complaint Urinary Frequency   HPI Kim Schmidt is a 41 y.o. female present for evaluation of 2 weeks of intermittent urinary frequency and urinary urgency. Denies any burning with urination, vaginal discharge, vaginal pain, vaginal complaints or odor to urine. Patient states that today she had some right flank pain that was primarily when going to use the restroom to urinate prompting her to be evaluated today. Patient reports pain is sometimes increased with movement. Denies any pain radiation. States pain stays in right lower back. Denies any abdominal pain or abdominal discomfort. Denies vomiting, nausea, diarrhea, fevers. Denies concerns STDs. Reports continues to eat and drink well with normal appetite. Reports last bowel movement was today and described as normal. Denies constipation. Denies any abnormal colored urine or bowel. Patient reports otherwise feels well. States has had UTIs in the past with some similar presentation. Patient states that because she had back pain she wanted to make sure her kidneys were okay.Patient denies any fall, injury or trauma.  Denies chest pain, shortness of breath, abdominal pain, dysuria, extremity pain, extremity swelling or rash. Denies recent sickness. Denies recent antibiotic use.   No LMP recorded. Patient has had a hysterectomy.  Copland, Deirdre Evener, PA-C: PCP   Past Medical History:  Diagnosis Date  . Abdominal hernia   . Anxiety   . Constipation 08/15/2014  . Depression   . Esophageal reflux 08/15/2014  . Family history of ovarian cancer 04/21/2012   MGM; Mom is BRCA/BART neg.  . Genetic testing of female 01/2016   MYRISK neg  . Hiatal hernia 08/15/2014  . History of Papanicolaou smear of cervix 04/21/2012   -/-  . Hyperlipidemia  04/21/2012  . Migraine    menstrual    There are no active problems to display for this patient.   Past Surgical History:  Procedure Laterality Date  . ABDOMINAL HYSTERECTOMY  05/14/2013   AMS; RSO; LSO;   . CESAREAN SECTION  11/12/2011   twins; PJR  . COLPOSCOPY  2004   PJR  . KNEE ARTHROSCOPY     RIGHT  . TONSILLECTOMY AND ADENOIDECTOMY    . TUBAL LIGATION       No current facility-administered medications for this encounter.   Current Outpatient Prescriptions:  .  Butalbital-APAP-Caffeine 50-325-40 MG capsule, Take 1-2 capsules by mouth every 6 (six) hours as needed for headache., Disp: 20 capsule, Rfl: 0 .  PARoxetine (PAXIL) 20 MG tablet, Take 1 tablet (20 mg total) by mouth daily., Disp: 30 tablet, Rfl: 4 .  ALPRAZolam (XANAX) 0.25 MG tablet, Take 1 tablet (0.25 mg total) by mouth at bedtime as needed for anxiety., Disp: 30 tablet, Rfl: 0 .  cyclobenzaprine (FLEXERIL) 10 MG tablet, Take 1 tablet (10 mg total) by mouth 2 (two) times daily as needed for muscle spasms. Do not drive while taking as can cause drowsiness, Disp: 15 tablet, Rfl: 0 .  phentermine (ADIPEX-P) 37.5 MG tablet, Take 1 tablet (37.5 mg total) by mouth daily before breakfast., Disp: 30 tablet, Rfl: 0  Allergies Sulfa antibiotics  Family History  Problem Relation Age of Onset  . Hypertension Mother   . Cancer Father 86       MELANOMA  . Cancer Maternal Grandmother 59       OVARY  . Breast cancer Paternal Grandmother 65  BRAIN    Social History Social History  Substance Use Topics  . Smoking status: Never Smoker  . Smokeless tobacco: Never Used  . Alcohol use Yes     Comment: OCC    Review of Systems Constitutional: No fever/chills Eyes: No visual changes. ENT: No sore throat. Cardiovascular: Denies chest pain. Respiratory: Denies shortness of breath. Gastrointestinal: No abdominal pain.  No nausea, no vomiting.  No diarrhea.  No constipation. Genitourinary:  As  above. Musculoskeletal:  As above. Skin: Negative for rash.   ____________________________________________   PHYSICAL EXAM:  VITAL SIGNS: ED Triage Vitals  Enc Vitals Group     BP 11/05/16 1845 127/80     Pulse Rate 11/05/16 1845 73     Resp 11/05/16 1845 18     Temp 11/05/16 1845 98.5 F (36.9 C)     Temp Source 11/05/16 1845 Oral     SpO2 11/05/16 1845 100 %     Weight 11/05/16 1843 240 lb (108.9 kg)     Height 11/05/16 1843 _0  (1.626 m)     Head Circumference --      Peak Flow --      Pain Score 11/05/16 1843 6     Pain Loc --      Pain Edu? --      Excl. in [place]? --     Constitutional: Alert and oriented. Well appearing and in no acute distress. Cardiovascular: Normal rate, regular rhythm. Grossly normal heart sounds.  Good peripheral circulation. Respiratory: Normal respiratory effort without tachypnea nor retractions. Breath sounds are clear and equal bilaterally. No wheezes, rales, rhonchi. Gastrointestinal: Soft and nontender. Obese abdomen.No left CVA tenderness.  Musculoskeletal: No midline cervical, thoracic or lumbar tenderness to palpation. Mild to moderate tenderness to right posterior latissimus dorsi with direct palpation and pain increases with right and left overhead stretching, no midline tenderness, no rib tenderness.  Neurologic:  Normal speech and language. Speech is normal. No gait instability.  Skin:  Skin is warm, dry and intact. No rash noted. Psychiatric: Mood and affect are normal. Speech and behavior are normal. Patient exhibits appropriate insight and judgment   ___________________________________________   LABS (all labs ordered are listed, but only abnormal results are displayed)  Labs Reviewed  URINALYSIS, COMPLETE (UACMP) WITH MICROSCOPIC - Abnormal; Notable for the following:       Result Value   Squamous Epithelial / LPF 0-5 (*)    All other components within normal limits  URINE CULTURE     PROCEDURES Procedures    INITIAL  IMPRESSION / ASSESSMENT AND PLAN / ED COURSE  Pertinent labs & imaging results that were available during my care of the patient were reviewed by me and considered in my medical decision making (see chart for details).  Very well-appearing patient. No acute distress. Discussed in detail with patient and urinalysis reviewed, no clear urinary tract infection. Will culture urine. Patient denies any vaginal complaints and declines pelvic exam. Discussed with patient if symptoms continue to encourage pelvic exam follow-up. Suspect muscular pain to right flank as pain is fully reproducible by direct palpation as well as with movements. Patient denies any fall, injury or trauma. Also discussed with patient other triggers of dysuria including sexual irritation, soaps and other irritants. Patient denies any known trigger. Patient states that she'll monitor and follow up with her primary as needed. Will treat with when necessary Flexeril and over-the-counter NSAIDs as needed for pain. Discussed strict follow-up and return parameters.Discussed indication, risks and  benefits of medications with patient.  Discussed follow up with Primary care physician this week. Discussed follow up and return parameters including no resolution or any worsening concerns. Patient verbalized understanding and agreed to plan.   ____________________________________________   FINAL CLINICAL IMPRESSION(S) / ED DIAGNOSES  Final diagnoses:  Dysuria  Right flank pain     Discharge Medication List as of 11/05/2016  7:03 PM    START taking these medications   Details  cyclobenzaprine (FLEXERIL) 10 MG tablet Take 1 tablet (10 mg total) by mouth 2 (two) times daily as needed for muscle spasms. Do not drive while taking as can cause drowsiness, Starting Tue 11/05/2016, Normal        Note: This dictation was prepared with Dragon dictation along with smaller phrase technology. Any transcriptional errors that result from this process are  unintentional.         Marylene Land, NP 11/05/16 2021

## 2016-11-05 NOTE — Discharge Instructions (Signed)
Take medication as prescribed. Rest. Drink plenty of fluids. Stretch.  ° °Follow up with your primary care physician this week as needed. Return to Urgent care for new or worsening concerns.  ° °

## 2016-11-05 NOTE — ED Triage Notes (Signed)
Patient complains of urinary urgency and frequency. Patient states that she feels like she has had some lower back pain that raditaes up. Patient reports that symptoms started 2 weeks ago.

## 2016-11-07 LAB — URINE CULTURE: Culture: NO GROWTH

## 2016-12-03 ENCOUNTER — Ambulatory Visit
Admission: EM | Admit: 2016-12-03 | Discharge: 2016-12-03 | Disposition: A | Discharge status: Home / Self Care | Payer: BLUE CROSS/BLUE SHIELD | Attending: Family Medicine | Admitting: Family Medicine

## 2016-12-03 ENCOUNTER — Ambulatory Visit (INDEPENDENT_AMBULATORY_CARE_PROVIDER_SITE_OTHER): Payer: BLUE CROSS/BLUE SHIELD

## 2016-12-03 ENCOUNTER — Encounter: Payer: Self-pay | Admitting: Emergency Medicine

## 2016-12-03 DIAGNOSIS — M79674 Pain in right toe(s): Secondary | ICD-10-CM

## 2016-12-03 DIAGNOSIS — M25579 Pain in unspecified ankle and joints of unspecified foot: Secondary | ICD-10-CM | POA: Diagnosis not present

## 2016-12-03 DIAGNOSIS — M25571 Pain in right ankle and joints of right foot: Secondary | ICD-10-CM

## 2016-12-03 DIAGNOSIS — E790 Hyperuricemia without signs of inflammatory arthritis and tophaceous disease: Secondary | ICD-10-CM

## 2016-12-03 LAB — CBC WITH DIFFERENTIAL/PLATELET
BASOS ABS: 0.1 10*3/uL (ref 0–0.1)
BASOS PCT: 1 %
EOS ABS: 0.3 10*3/uL (ref 0–0.7)
EOS PCT: 3 %
HCT: 37.4 % (ref 35.0–47.0)
Hemoglobin: 13 g/dL (ref 12.0–16.0)
LYMPHS PCT: 27 %
Lymphs Abs: 2.7 10*3/uL (ref 1.0–3.6)
MCH: 29.7 pg (ref 26.0–34.0)
MCHC: 34.6 g/dL (ref 32.0–36.0)
MCV: 85.9 fL (ref 80.0–100.0)
Monocytes Absolute: 0.8 10*3/uL (ref 0.2–0.9)
Monocytes Relative: 8 %
Neutro Abs: 6.2 10*3/uL (ref 1.4–6.5)
Neutrophils Relative %: 61 %
PLATELETS: 323 10*3/uL (ref 150–440)
RBC: 4.36 MIL/uL (ref 3.80–5.20)
RDW: 13.2 % (ref 11.5–14.5)
WBC: 10 10*3/uL (ref 3.6–11.0)

## 2016-12-03 LAB — COMPREHENSIVE METABOLIC PANEL
ALBUMIN: 3.8 g/dL (ref 3.5–5.0)
ALT: 19 U/L (ref 14–54)
AST: 20 U/L (ref 15–41)
Alkaline Phosphatase: 72 U/L (ref 38–126)
Anion gap: 9 (ref 5–15)
BUN: 11 mg/dL (ref 6–20)
CHLORIDE: 103 mmol/L (ref 101–111)
CO2: 22 mmol/L (ref 22–32)
CREATININE: 0.72 mg/dL (ref 0.44–1.00)
Calcium: 8.5 mg/dL — ABNORMAL LOW (ref 8.9–10.3)
GFR calc Af Amer: >60 mL/min (ref >60)
GFR calc non Af Amer: >60 mL/min (ref >60)
Glucose, Bld: 94 mg/dL (ref 65–99)
POTASSIUM: 3.8 mmol/L (ref 3.5–5.1)
SODIUM: 134 mmol/L — AB (ref 135–145)
Total Bilirubin: 0.3 mg/dL (ref 0.3–1.2)
Total Protein: 7.3 g/dL (ref 6.5–8.1)

## 2016-12-03 LAB — URIC ACID: Uric Acid, Serum: 6.3 mg/dL (ref 2.3–6.6)

## 2016-12-03 MED ORDER — COLCHICINE 0.6 MG PO TABS
1.2000 mg | ORAL_TABLET | Freq: Once | ORAL | Status: AC
  Administered 2016-12-03: 1.2 mg via ORAL

## 2016-12-03 MED ORDER — COLCHICINE 0.6 MG PO TABS: 0.6000 mg | ORAL_TABLET | Freq: Every day | ORAL | 0 refills | Status: DC

## 2016-12-03 MED ORDER — KETOROLAC TROMETHAMINE 60 MG/2ML IM SOLN
60.0000 mg | Freq: Once | INTRAMUSCULAR | Status: AC
  Administered 2016-12-03: 60 mg via INTRAMUSCULAR

## 2016-12-03 MED ORDER — NAPROXEN 500 MG PO TABS: 500.0000 mg | ORAL_TABLET | Freq: Two times a day (BID) | ORAL | 0 refills | Status: DC

## 2016-12-03 NOTE — ED Provider Notes (Signed)
[facility]    CSN: [id number] Arrival date & time: 12/03/16  1641     History   Chief Complaint Chief Complaint  Patient presents with  . Toe Pain    right 2nd toe    HPI Kim Schmidt is a 41 y.o. female.   Patient is a 41 year old white female who is complaining of right foot pain that started spontaneously yesterday evening. It persisted during the night got worse.-Status afternoon starting to get a little bit better. She denies any trauma or injury to the right foot or to the second and third toe where the pain is at its worse. Denies any history gout or other metabolic disorder she denies any chronic medications no known drug allergies she does not smoke. Only surgeries C-sections tonsillectomy and adenoidectomy tubal ligation and abdominal hysterectomy no pertinent family medical history relevant today's visit   The history is provided by the patient. No language interpreter was used.  Toe Pain  This is a new problem. The current episode started yesterday. The problem occurs constantly. The problem has been gradually improving. Pertinent negatives include no chest pain, no abdominal pain, no headaches and no shortness of breath. The symptoms are aggravated by standing and walking. Nothing relieves the symptoms. She has tried nothing for the symptoms. The treatment provided no relief.    Past Medical History:  Diagnosis Date  . Abdominal hernia   . Anxiety   . Constipation 08/15/2014  . Depression   . Esophageal reflux 08/15/2014  . Family history of ovarian cancer 04/21/2012   MGM; Mom is BRCA/BART neg.  . Genetic testing of female 01/2016   MYRISK neg  . Hiatal hernia 08/15/2014  . History of Papanicolaou smear of cervix 04/21/2012   -/-  . Hyperlipidemia 04/21/2012  . Migraine    menstrual    There are no active problems to display for this patient.   Past Surgical History:  Procedure Laterality Date  . ABDOMINAL HYSTERECTOMY   05/14/2013   AMS; RSO; LSO;   . CESAREAN SECTION  11/12/2011   twins; PJR  . COLPOSCOPY  2004   PJR  . KNEE ARTHROSCOPY     RIGHT  . TONSILLECTOMY AND ADENOIDECTOMY    . TUBAL LIGATION      OB History    Gravida Para Term Preterm AB Living   _0 SAB TAB Ectopic Multiple Live Births   _1 Home Medications    Prior to Admission medications   Medication Sig Start Date End Date Taking? Authorizing Provider  Butalbital-APAP-Caffeine 50-325-40 MG capsule Take 1-2 capsules by mouth every 6 (six) hours as needed for headache. 0/10/27   Copland, Elmo Putt B, PA-C  colchicine (COLCRYS) 0.6 MG tablet Take 1 tablet (0.6 mg total) by mouth daily. Take 1 tablet daily for 7-10 days 12/03/16   Frederich Cha, MD  naproxen (NAPROSYN) 500 MG tablet Take 1 tablet (500 mg total) by mouth 2 (two) times daily. 12/03/16   Frederich Cha, MD  PARoxetine (PAXIL) 20 MG tablet Take 1 tablet (20 mg total) by mouth daily. 2/53/66   Copland, Deirdre Evener, PA-C  phentermine (ADIPEX-P) 37.5 MG tablet Take 1 tablet (37.5 mg total) by mouth daily before breakfast. 4/40/34   Copland, Deirdre Evener, PA-C    Family History Family History  Problem Relation Age of Onset  . Hypertension Mother   . Cancer Father  60       MELANOMA  . Cancer Maternal Grandmother 14       OVARY  . Breast cancer Paternal Grandmother 28       BRAIN    Social History Social History  Substance Use Topics  . Smoking status: Never Smoker  . Smokeless tobacco: Never Used  . Alcohol use Yes     Comment: OCC     Allergies   Sulfa antibiotics   Review of Systems Review of Systems  Respiratory: Negative for shortness of breath.   Cardiovascular: Negative for chest pain.  Gastrointestinal: Negative for abdominal pain.  Musculoskeletal: Positive for arthralgias, joint swelling and myalgias.  Neurological: Negative for headaches.  All other systems reviewed and are negative.    Physical Exam Triage Vital Signs ED  Triage Vitals  Enc Vitals Group     BP 12/03/16 1706 (!) 142/75     Pulse Rate 12/03/16 1706 75     Resp 12/03/16 1706 16     Temp 12/03/16 1706 98.3 F (36.8 C)     Temp Source 12/03/16 1706 Oral     SpO2 12/03/16 1706 100 %     Weight 12/03/16 1705 240 lb (108.9 kg)     Height 12/03/16 1705 _0  (1.626 m)     Head Circumference --      Peak Flow --      Pain Score 12/03/16 1705 5     Pain Loc --      Pain Edu? --      Excl. in [place]? --    No data found.   Updated Vital Signs BP (!) 142/75 (BP Location: Left Arm)   Pulse 75   Temp 98.3 F (36.8 C) (Oral)   Resp 16   Ht _1  (1.626 m)   Wt 240 lb (108.9 kg)   SpO2 100%   BMI 41.20 kg/m   Visual Acuity Right Eye Distance:   Left Eye Distance:   Bilateral Distance:    Right Eye Near:   Left Eye Near:    Bilateral Near:     Physical Exam  Constitutional: She is oriented to person, place, and time. She appears well-developed and well-nourished. No distress.  HENT:  Head: Normocephalic and atraumatic.  Eyes: Pupils are equal, round, and reactive to light.  Neck: Normal range of motion.  Pulmonary/Chest: Effort normal.  Musculoskeletal: She exhibits tenderness. She exhibits no deformity.       Right foot: There is tenderness and bony tenderness.       Feet:  Neurological: She is alert and oriented to person, place, and time.  Skin: Skin is warm. No rash noted. No erythema.  Psychiatric: She has a normal mood and affect.  Vitals reviewed.    UC Treatments / Results  Labs (all labs ordered are listed, but only abnormal results are displayed) Labs Reviewed  COMPREHENSIVE METABOLIC PANEL - Abnormal; Notable for the following:       Result Value   Sodium 134 (*)    Calcium 8.5 (*)    All other components within normal limits  CBC WITH DIFFERENTIAL/PLATELET  URIC ACID    EKG  EKG Interpretation None       Radiology Dg Foot Complete Right  Result Date: 12/03/2016 CLINICAL DATA:  Right second toe  pain. EXAM: RIGHT FOOT COMPLETE - 3+ VIEW COMPARISON:  None. FINDINGS: There is no evidence of fracture or dislocation. There is no evidence of arthropathy or other focal bone  abnormality. Soft tissues are unremarkable. IMPRESSION: Negative. Electronically Signed   By: Rolm Baptise M.D.   On: 12/03/2016 18:12    Procedures Procedures (including critical care time)  Medications Ordered in UC Medications  colchicine tablet 1.2 mg (1.2 mg Oral Given 12/03/16 1800)  ketorolac (TORADOL) injection 60 mg (60 mg Intramuscular Given 12/03/16 1757)    Results for orders placed or performed during the hospital encounter of 12/03/16  CBC with Differential  Result Value Ref Range   WBC 10.0 3.6 - 11.0 K/uL   RBC 4.36 3.80 - 5.20 MIL/uL   Hemoglobin 13.0 12.0 - 16.0 g/dL   HCT 37.4 35.0 - 47.0 %   MCV 85.9 80.0 - 100.0 fL   MCH 29.7 26.0 - 34.0 pg   MCHC 34.6 32.0 - 36.0 g/dL   RDW 13.2 11.5 - 14.5 %   Platelets 323 150 - 440 K/uL   Neutrophils Relative % 61 %   Neutro Abs 6.2 1.4 - 6.5 K/uL   Lymphocytes Relative 27 %   Lymphs Abs 2.7 1.0 - 3.6 K/uL   Monocytes Relative 8 %   Monocytes Absolute 0.8 0.2 - 0.9 K/uL   Eosinophils Relative 3 %   Eosinophils Absolute 0.3 0 - 0.7 K/uL   Basophils Relative 1 %   Basophils Absolute 0.1 0 - 0.1 K/uL  Comprehensive metabolic panel  Result Value Ref Range   Sodium 134 (L) 135 - 145 mmol/L   Potassium 3.8 3.5 - 5.1 mmol/L   Chloride 103 101 - 111 mmol/L   CO2 22 22 - 32 mmol/L   Glucose, Bld 94 65 - 99 mg/dL   BUN 11 6 - 20 mg/dL   Creatinine, Ser 0.72 0.44 - 1.00 mg/dL   Calcium 8.5 (L) 8.9 - 10.3 mg/dL   Total Protein 7.3 6.5 - 8.1 g/dL   Albumin 3.8 3.5 - 5.0 g/dL   AST 20 15 - 41 U/L   ALT 19 14 - 54 U/L   Alkaline Phosphatase 72 38 - 126 U/L   Total Bilirubin 0.3 0.3 - 1.2 mg/dL   GFR calc non Af Amer >60 >60 mL/min   GFR calc Af Amer >60 >60 mL/min   Anion gap 9 5 - 15  Uric acid  Result Value Ref Range   Uric Acid, Serum 6.3 2.3 -  6.6 mg/dL   Initial Impression / Assessment and Plan / UC Course  I have reviewed the triage vital signs and the nursing notes.  Pertinent labs & imaging results that were available during my care of the patient were reviewed by me and considered in my medical decision making (see chart for details).    Patient was given 60 Toradol IM and 1.2 mg of po colchicine   Patient's uric acid was 6.3 the new standard for elevated uric acid is 6.6 still I explained to her that my understanding was that it was 6 regardless uric acid is high normal elevated and reflux he has gout" seen 1 tablet day and Naprosyn 500 twice a day for least a week follow-up with PCP she declines work note Final Clinical Impressions(s) / UC Diagnoses   Final diagnoses:  Pain in joint involving ankle and foot, unspecified laterality  Elevated uric acid in blood    New Prescriptions New Prescriptions   COLCHICINE (COLCRYS) 0.6 MG TABLET    Take 1 tablet (0.6 mg total) by mouth daily. Take 1 tablet daily for 7-10 days   NAPROXEN (NAPROSYN) 500 MG  TABLET    Take 1 tablet (500 mg total) by mouth 2 (two) times daily.    Note: This dictation was prepared with Dragon dictation along with smaller phrase technology. Any transcriptional errors that result from this process are unintentional.    Frederich Cha, MD 12/03/16 [phone]

## 2016-12-03 NOTE — ED Triage Notes (Signed)
Patient c/o pain in her right 2nd toe and top of her right foot that started yesterday.  Patient also reports swelling in her foot.  Patient denies fall or injury.

## 2017-01-27 ENCOUNTER — Ambulatory Visit: Payer: BLUE CROSS/BLUE SHIELD | Admitting: Obstetrics and Gynecology

## 2017-01-31 ENCOUNTER — Other Ambulatory Visit: Payer: Self-pay | Admitting: Obstetrics and Gynecology

## 2017-01-31 NOTE — Telephone Encounter (Signed)
Please advise for refill. Thank you.  

## 2017-02-04 ENCOUNTER — Other Ambulatory Visit: Payer: Self-pay

## 2017-02-06 ENCOUNTER — Ambulatory Visit: Payer: BLUE CROSS/BLUE SHIELD | Admitting: Obstetrics and Gynecology

## 2017-07-02 ENCOUNTER — Other Ambulatory Visit: Payer: Self-pay | Admitting: Obstetrics and Gynecology

## 2017-07-15 ENCOUNTER — Ambulatory Visit: Payer: BLUE CROSS/BLUE SHIELD | Admitting: Obstetrics and Gynecology

## 2017-07-23 ENCOUNTER — Other Ambulatory Visit: Payer: Self-pay | Admitting: Obstetrics and Gynecology

## 2017-07-23 DIAGNOSIS — F329 Major depressive disorder, single episode, unspecified: Secondary | ICD-10-CM

## 2017-07-23 DIAGNOSIS — F419 Anxiety disorder, unspecified: Principal | ICD-10-CM

## 2017-07-23 NOTE — Telephone Encounter (Signed)
No, Pt N/S for last appt and hasn't rescheduled.

## 2017-07-23 NOTE — Telephone Encounter (Signed)
Please advise 

## 2017-07-25 ENCOUNTER — Other Ambulatory Visit: Payer: Self-pay | Admitting: Obstetrics and Gynecology

## 2017-08-06 ENCOUNTER — Ambulatory Visit: Payer: Self-pay | Admitting: Obstetrics and Gynecology

## 2017-08-23 ENCOUNTER — Other Ambulatory Visit: Payer: Self-pay | Admitting: Obstetrics and Gynecology

## 2017-08-26 ENCOUNTER — Other Ambulatory Visit: Payer: Self-pay | Admitting: Obstetrics and Gynecology

## 2017-09-03 ENCOUNTER — Ambulatory Visit (INDEPENDENT_AMBULATORY_CARE_PROVIDER_SITE_OTHER): Payer: Managed Care, Other (non HMO) | Admitting: Obstetrics and Gynecology

## 2017-09-03 ENCOUNTER — Encounter: Payer: Self-pay | Admitting: Obstetrics and Gynecology

## 2017-09-03 VITALS — BP 126/90 | HR 90 | Ht 64.0 in | Wt 231.0 lb

## 2017-09-03 DIAGNOSIS — F419 Anxiety disorder, unspecified: Secondary | ICD-10-CM

## 2017-09-03 DIAGNOSIS — N951 Menopausal and female climacteric states: Secondary | ICD-10-CM | POA: Diagnosis not present

## 2017-09-03 DIAGNOSIS — Z8041 Family history of malignant neoplasm of ovary: Secondary | ICD-10-CM | POA: Insufficient documentation

## 2017-09-03 DIAGNOSIS — Z131 Encounter for screening for diabetes mellitus: Secondary | ICD-10-CM | POA: Diagnosis not present

## 2017-09-03 DIAGNOSIS — N643 Galactorrhea not associated with childbirth: Secondary | ICD-10-CM

## 2017-09-03 DIAGNOSIS — Z0000 Encounter for general adult medical examination without abnormal findings: Secondary | ICD-10-CM | POA: Diagnosis not present

## 2017-09-03 DIAGNOSIS — F329 Major depressive disorder, single episode, unspecified: Secondary | ICD-10-CM

## 2017-09-03 DIAGNOSIS — Z01419 Encounter for gynecological examination (general) (routine) without abnormal findings: Secondary | ICD-10-CM | POA: Diagnosis not present

## 2017-09-03 DIAGNOSIS — Z1239 Encounter for other screening for malignant neoplasm of breast: Secondary | ICD-10-CM

## 2017-09-03 DIAGNOSIS — G43919 Migraine, unspecified, intractable, without status migrainosus: Secondary | ICD-10-CM | POA: Diagnosis not present

## 2017-09-03 DIAGNOSIS — Z1329 Encounter for screening for other suspected endocrine disorder: Secondary | ICD-10-CM | POA: Diagnosis not present

## 2017-09-03 DIAGNOSIS — Z1231 Encounter for screening mammogram for malignant neoplasm of breast: Secondary | ICD-10-CM | POA: Diagnosis not present

## 2017-09-03 MED ORDER — BUTALBITAL-APAP-CAFFEINE 50-325-40 MG PO CAPS: 1.0000 | ORAL_CAPSULE | Freq: Four times a day (QID) | ORAL | 0 refills | Status: DC | PRN

## 2017-09-03 MED ORDER — PAROXETINE HCL 20 MG PO TABS: ORAL_TABLET | ORAL | 1 refills | Status: DC

## 2017-09-03 NOTE — Progress Notes (Signed)
PCP:  Chad Cordial, PA-C   Chief Complaint  Patient presents with  . Gynecologic Exam     HPI:      [name] is a 42 y.o. [phone] who LMP was No LMP recorded. Patient has had a hysterectomy., presents today for her annual examination.  Her menses are absent due to hyst/RSO.   Dysmenorrhea none. She does not have intermenstrual bleeding. Has terrible hot flashes and night sweats. Hx of migraines about once monthly, lasting about 4 days. Takes fioricet prn sx with relief. Needs Rx RF.   Sex activity: not sexually active.  Last Pap: January 03, 2016  Results were: no abnormalities /neg HPV DNA  Hx of STDs: none  Last mammogram: not recently There is no FH of breast cancer. There is a FH of ovarian cancer in her MGM. Pt's mom is BRCA 1 and 2 neg. Pt is MyRisk neg 8/17.  The patient does do self-breast exams. She has noticed occas galactorrhea. No recent thyroid/prolactin labs.   Tobacco use: The patient denies current or previous tobacco use. Alcohol use: none No drug use.  Exercise: not active  She does get adequate calcium and Vitamin D in her diet.  Low Vit D 01/2016, taking supp now.  Has not had recent lipids, but not fasting today.   Pt also on paxil 10 mg (1/2 tab of 20 mg dose) now for anxiety/depression sx with sx relief. Pt's life is settled now and doing well. Wants to cont meds. No side effects.    Past Medical History:  Diagnosis Date  . Abdominal hernia   . Anxiety   . Constipation 08/15/2014  . Depression   . Esophageal reflux 08/15/2014  . Family history of ovarian cancer 04/21/2012   MGM; Mom is BRCA/BART neg.  . Genetic testing of female 01/2016   MYRISK neg  . Hiatal hernia 08/15/2014  . History of Papanicolaou smear of cervix 04/21/2012   -/-  . Hyperlipidemia 04/21/2012  . Migraine    menstrual    Past Surgical History:  Procedure Laterality Date  . ABDOMINAL HYSTERECTOMY  05/14/2013   AMS; RSO; LT salpingectomy  .  CESAREAN SECTION  11/12/2011   twins; PJR  . COLPOSCOPY  2004   [facility]  . KNEE ARTHROSCOPY     RIGHT  . TONSILLECTOMY AND ADENOIDECTOMY    . TUBAL LIGATION      Family History  Problem Relation Age of Onset  . Hypertension Mother   . Cancer Father 53       MELANOMA  . Cancer Maternal Grandmother 67       OVARY  . Breast cancer Paternal Grandmother 14       BRAIN    Social History   Socioeconomic History  . Marital status: Married    Spouse name: Not on file  . Number of children: Not on file  . Years of education: Not on file  . Highest education level: Not on file  Occupational History  . Not on file  Social Needs  . Financial resource strain: Not on file  . Food insecurity:    Worry: Not on file    Inability: Not on file  . Transportation needs:    Medical: Not on file    Non-medical: Not on file  Tobacco Use  . Smoking status: Never Smoker  . Smokeless tobacco: Never Used  Substance and Sexual Activity  . Alcohol use: Yes    Comment: OCC  .  Drug use: No  . Sexual activity: Yes    Partners: Male    Birth control/protection: Surgical  Lifestyle  . Physical activity:    Days per week: Not on file    Minutes per session: Not on file  . Stress: Not on file  Relationships  . Social connections:    Talks on phone: Not on file    Gets together: Not on file    Attends religious service: Not on file    Active member of club or organization: Not on file    Attends meetings of clubs or organizations: Not on file    Relationship status: Not on file  . Intimate partner violence:    Fear of current or ex partner: Not on file    Emotionally abused: Not on file    Physically abused: Not on file    Forced sexual activity: Not on file  Other Topics Concern  . Not on file  Social History Narrative  . Not on file    Outpatient Medications Prior to Visit  Medication Sig Dispense Refill  . ALPRAZolam (XANAX) 0.25 MG tablet TAKE 1 TABLET BY MOUTH AT BEDTIME AS  NEEDED FOR ANXIETY 30 tablet 0  . Butalbital-APAP-Caffeine 50-325-40 MG capsule Take 1-2 capsules by mouth every 6 (six) hours as needed for headache. 20 capsule 0  . PARoxetine (PAXIL) 20 MG tablet TAKE 1 TABLET (20 MG TOTAL) BY MOUTH DAILY. 30 tablet 0  . colchicine (COLCRYS) 0.6 MG tablet Take 1 tablet (0.6 mg total) by mouth daily. Take 1 tablet daily for 7-10 days 30 tablet 0  . naproxen (NAPROSYN) 500 MG tablet Take 1 tablet (500 mg total) by mouth 2 (two) times daily. 30 tablet 0  . PARoxetine (PAXIL) 20 MG tablet Take 1 tablet (20 mg total) by mouth daily. 30 tablet 4  . phentermine (ADIPEX-P) 37.5 MG tablet Take 1 tablet (37.5 mg total) by mouth daily before breakfast. 30 tablet 0   No facility-administered medications prior to visit.      ROS:  Review of Systems  Constitutional: Negative for fatigue, fever and unexpected weight change.  Respiratory: Negative for cough, shortness of breath and wheezing.   Cardiovascular: Negative for chest pain, palpitations and leg swelling.  Gastrointestinal: Negative for blood in stool, constipation, diarrhea, nausea and vomiting.  Endocrine: Negative for cold intolerance, heat intolerance and polyuria.  Genitourinary: Negative for dyspareunia, dysuria, flank pain, frequency, genital sores, hematuria, menstrual problem, pelvic pain, urgency, vaginal bleeding, vaginal discharge and vaginal pain.  Musculoskeletal: Negative for back pain, joint swelling and myalgias.  Skin: Negative for rash.  Neurological: Positive for headaches. Negative for dizziness, syncope, light-headedness and numbness.  Hematological: Negative for adenopathy.  Psychiatric/Behavioral: Positive for agitation and dysphoric mood. Negative for confusion, sleep disturbance and suicidal ideas. The patient is not nervous/anxious.    BREAST: No symptoms   Objective: BP 126/90   Pulse 90   Ht 5' 4"  (1.626 m)   Wt 231 lb (104.8 kg)   BMI 39.65 kg/m    Physical Exam    Constitutional: She is oriented to person, place, and time. She appears well-developed and well-nourished.  Genitourinary: Vagina normal. There is no rash or tenderness on the right labia. There is no rash or tenderness on the left labia. No erythema or tenderness in the vagina. No vaginal discharge found. Right adnexum does not display mass and does not display tenderness. Left adnexum does not display mass and does not display tenderness.  Genitourinary  Comments: UTERUS/CX SURG REM  Neck: Normal range of motion. No thyromegaly present.  Cardiovascular: Normal rate, regular rhythm and normal heart sounds.  No murmur heard. Pulmonary/Chest: Effort normal and breath sounds normal. Right breast exhibits no mass, no nipple discharge, no skin change and no tenderness. Left breast exhibits no mass, no nipple discharge, no skin change and no tenderness.  Abdominal: Soft. There is no tenderness. There is no guarding.  Musculoskeletal: Normal range of motion.  Neurological: She is alert and oriented to person, place, and time. No cranial nerve deficit.  Psychiatric: She has a normal mood and affect. Her behavior is normal.  Vitals reviewed.   Results: GAD-7=2 PHQ-9=2  Assessment/Plan: Encounter for annual routine gynecological examination  Screening for breast cancer - Pt to sched mammo. - Plan: MM DIGITAL SCREENING BILATERAL  Anxiety and depression - Sx well controlled on paxil 10 mg. Rx RF 20 mg dose per pt requent, can take 1/2-1 prn sx.  - Plan: PARoxetine (PAXIL) 20 MG tablet  Family history of ovarian cancer - MyRisk neg 2017.  Blood tests for routine general physical examination - Plan: Comprehensive metabolic panel, Hemoglobin A1c, TSH + free T4, Prolactin  Screening for diabetes mellitus - Plan: Hemoglobin A1c  Thyroid disorder screening - Plan: TSH + free T4  Galactorrhea - Check thyroid/prolactin. If neg, reassurance. F/u prn.  - Plan: TSH + free T4, Prolactin  Perimenopausal  vasomotor symptoms - Still has LT ovary. Chck labs. Will f/u with results. On paxil. Increase exercise.   Intractable migraine without status migrainosus, unspecified migraine type - Rx RF fioricet to take prn HA. F/u prn.  - Plan: Butalbital-APAP-Caffeine 50-325-40 MG capsule  Meds ordered this encounter  Medications  . PARoxetine (PAXIL) 20 MG tablet    Sig: Take 1/2 to 1 tab daily for sx    Dispense:  90 tablet    Refill:  1    Order Specific Question:   Supervising Provider    Answer:   Gae Dry [id number]  . Butalbital-APAP-Caffeine 50-325-40 MG capsule    Sig: Take 1-2 capsules by mouth every 6 (six) hours as needed for headache.    Dispense:  20 capsule    Refill:  0    Order Specific Question:   Supervising Provider    Answer:   Gae Dry [[id number]]             GYN counsel breast self exam, mammography screening, menopause, adequate intake of calcium and vitamin D, diet and exercise     F/U  Return in about 1 year (around 09/04/2018).  Bayla Mcgovern B. Billie Trager, PA-C 09/03/2017 4:31 PM

## 2017-09-03 NOTE — Patient Instructions (Addendum)
I value your feedback and entrusting us with your care. If you get a [facility] patient survey, I would appreciate you taking the time to let us know about your experience today. Thank you!  [facility]: [phone]  [facility]: [phone]  

## 2017-09-04 ENCOUNTER — Telehealth: Payer: Self-pay | Admitting: Obstetrics and Gynecology

## 2017-09-04 DIAGNOSIS — R899 Unspecified abnormal finding in specimens from other organs, systems and tissues: Secondary | ICD-10-CM

## 2017-09-04 LAB — TSH+FREE T4
Free T4: 0.85 ng/dL (ref 0.82–1.77)
TSH: 3.03 u[IU]/mL (ref 0.450–4.500)

## 2017-09-04 LAB — COMPREHENSIVE METABOLIC PANEL
ALK PHOS: 80 IU/L (ref 39–117)
ALT: 14 IU/L (ref 0–32)
AST: 10 IU/L (ref 0–40)
Albumin/Globulin Ratio: 1.3 (ref 1.2–2.2)
Albumin: 4 g/dL (ref 3.5–5.5)
BUN/Creatinine Ratio: 11 (ref 9–23)
BUN: 9 mg/dL (ref 6–24)
Bilirubin Total: <0.2 mg/dL (ref 0.0–1.2)
CALCIUM: 9.1 mg/dL (ref 8.7–10.2)
CO2: 22 mmol/L (ref 20–29)
CREATININE: 0.79 mg/dL (ref 0.57–1.00)
Chloride: 102 mmol/L (ref 96–106)
GFR calc Af Amer: 108 mL/min/{1.73_m2} (ref >59)
GFR, EST NON AFRICAN AMERICAN: 93 mL/min/{1.73_m2} (ref >59)
GLUCOSE: 84 mg/dL (ref 65–99)
Globulin, Total: 3.1 g/dL (ref 1.5–4.5)
Potassium: 4.3 mmol/L (ref 3.5–5.2)
Sodium: 136 mmol/L (ref 134–144)
Total Protein: 7.1 g/dL (ref 6.0–8.5)

## 2017-09-04 LAB — HEMOGLOBIN A1C
ESTIMATED AVERAGE GLUCOSE: 105 mg/dL
HEMOGLOBIN A1C: 5.3 % (ref 4.8–5.6)

## 2017-09-04 LAB — PROLACTIN: Prolactin: 27.7 ng/mL — ABNORMAL HIGH (ref 4.8–23.3)

## 2017-09-04 NOTE — Telephone Encounter (Signed)
Patient is calling for labs results. Please advise. 

## 2017-09-04 NOTE — Telephone Encounter (Signed)
Pt aware of labs and slightly elevated prolactin. Rechk fasting at 10:00 AM in 1-2 wks. Will f/u with results. Pt with galactorrhea. All other labs neg.

## 2017-10-17 ENCOUNTER — Other Ambulatory Visit: Payer: Self-pay | Admitting: Obstetrics and Gynecology

## 2017-10-17 NOTE — Telephone Encounter (Signed)
Please advise. Thank you

## 2018-01-28 ENCOUNTER — Ambulatory Visit (INDEPENDENT_AMBULATORY_CARE_PROVIDER_SITE_OTHER): Payer: Self-pay | Admitting: Obstetrics and Gynecology

## 2018-01-28 ENCOUNTER — Other Ambulatory Visit: Payer: Self-pay

## 2018-01-28 ENCOUNTER — Encounter: Payer: Self-pay | Admitting: Obstetrics and Gynecology

## 2018-01-28 VITALS — BP 124/82 | HR 92 | Ht 64.0 in | Wt 217.0 lb

## 2018-01-28 DIAGNOSIS — N6323 Unspecified lump in the left breast, lower outer quadrant: Secondary | ICD-10-CM

## 2018-01-28 DIAGNOSIS — N6320 Unspecified lump in the left breast, unspecified quadrant: Secondary | ICD-10-CM

## 2018-01-28 DIAGNOSIS — Z1231 Encounter for screening mammogram for malignant neoplasm of breast: Secondary | ICD-10-CM

## 2018-01-28 DIAGNOSIS — Z1239 Encounter for other screening for malignant neoplasm of breast: Secondary | ICD-10-CM

## 2018-01-28 NOTE — Progress Notes (Signed)
 Chief Complaint  Patient presents with  . Breast Problem    L breast lump     HPI:      [name] is a 41 y.o. G3P2013 who LMP was No LMP recorded. Patient has had a hysterectomy., presents today for breast symptoms. She complains of LT breast mass noted a few days ago. Pt was scratching an itch under her bra and felt it. Doesn't regularly do SBE. Mass without pain, tenderness, itch. She does drink caffeine. No FH of breast cancer but has FH ovar cancer. Pt is MyRisk neg. No recent mammo. No hx of breast masses. Hx of galactorrhea earlier with yr with normal TSH, slightly elevated prolactin. Pt's sx resolved.  Last annual 4/19.   Past Medical History:  Diagnosis Date  . Abdominal hernia   . Anxiety   . Constipation 08/15/2014  . Depression   . Esophageal reflux 08/15/2014  . Family history of ovarian cancer 04/21/2012   MGM; Mom is BRCA/BART neg.  . Genetic testing of female 01/2016   MYRISK neg  . Hiatal hernia 08/15/2014  . History of Papanicolaou smear of cervix 04/21/2012   -/-  . Hyperlipidemia 04/21/2012  . Migraine    menstrual    Past Surgical History:  Procedure Laterality Date  . ABDOMINAL HYSTERECTOMY  05/14/2013   AMS; RSO; LT salpingectomy  . CESAREAN SECTION  11/12/2011   twins; PJR  . COLPOSCOPY  2004   PJR  . KNEE ARTHROSCOPY     RIGHT  . TONSILLECTOMY AND ADENOIDECTOMY    . TUBAL LIGATION      Family History  Problem Relation Age of Onset  . Hypertension Mother   . Cancer Father 60       MELANOMA  . Cancer Maternal Grandmother 48       OVARY  . Breast cancer Paternal Grandmother 63       BRAIN    Current Outpatient Medications on File Prior to Visit  Medication Sig Dispense Refill  . ALPRAZolam (XANAX) 0.25 MG tablet TAKE 1 TABLET BY MOUTH AT BEDTIME AS NEEDED FOR ANXIETY 30 tablet 0  . Butalbital-APAP-Caffeine 50-325-40 MG capsule Take 1-2 capsules by mouth every 6 (six) hours as needed for headache. 20 capsule 0  .  PARoxetine (PAXIL) 20 MG tablet Take 1/2 to 1 tab daily for sx 90 tablet 1   No current facility-administered medications on file prior to visit.      ROS:  Review of Systems  Constitutional: Negative for fever, malaise/fatigue and weight loss.  HENT: Negative for congestion, ear pain and sinus pain.   Respiratory: Negative for cough, shortness of breath and wheezing.   Cardiovascular: Negative for chest pain, orthopnea and leg swelling.  Gastrointestinal: Negative for constipation, diarrhea, nausea and vomiting.  Genitourinary: Negative for dysuria, frequency, hematuria and urgency.       Breast ROS: negative   Musculoskeletal: Negative for back pain, joint pain and myalgias.  Skin: Negative for itching and rash.  Neurological: Negative for dizziness, tingling, focal weakness and headaches.  Endo/Heme/Allergies: Negative for environmental allergies. Does not bruise/bleed easily.  Psychiatric/Behavioral: Negative for depression and suicidal ideas. The patient is not nervous/anxious and does not have insomnia.    Breast ROS: positive for - new or changing breast lumps   Objective: BP 124/82 (BP Location: Left Arm, Patient Position: Sitting, Cuff Size: Normal)   Pulse 92   Ht 5' 4" (1.626 m)   Wt 217 lb (98.4 kg)     BMI 37.25 kg/m    Physical Exam  Constitutional: She is oriented to person, place, and time. She appears well-developed.  Pulmonary/Chest: She exhibits mass. She exhibits no tenderness. Right breast exhibits no mass, no nipple discharge and no skin change. Left breast exhibits mass. Left breast exhibits no nipple discharge, no skin change and no tenderness.  ~1 CM FIRM, MOBILE, NT MASS INFERIOR BREAST/QUESTION CHEST WALL; 5:30 POS; RT BREAST WNL; NO NIPPLE D/C    Neurological: She is alert and oriented to person, place, and time. No cranial nerve deficit.  Skin: Skin is warm and dry.  Psychiatric: She has a normal mood and affect. Her behavior is normal. Thought  content normal.  Vitals reviewed.   Assessment/Plan: Left breast mass - Question breast vs chest wall. Probably simple cyst. Check dx mammo/u/s. Will f/u with results. - Plan: MM DIAG BREAST TOMO BILATERAL, US BREAST LTD UNI LEFT INC AXILLA, US BREAST LTD UNI RIGHT INC AXILLA  Screening for breast cancer - Plan: MM DIAG BREAST TOMO BILATERAL, US BREAST LTD UNI LEFT INC AXILLA, US BREAST LTD UNI RIGHT INC AXILLA    F/U  Return if symptoms worsen or fail to improve.  Khamryn Calderone B. Kyrstan Gotwalt, PA-C 01/28/2018 4:55 PM

## 2018-01-28 NOTE — Patient Instructions (Signed)
I value your feedback and entrusting us with your care. If you get a [facility] patient survey, I would appreciate you taking the time to let us know about your experience today. Thank you! 

## 2018-02-23 ENCOUNTER — Other Ambulatory Visit: Payer: Self-pay | Admitting: Obstetrics and Gynecology

## 2018-03-07 ENCOUNTER — Other Ambulatory Visit: Payer: Self-pay | Admitting: Obstetrics and Gynecology

## 2018-03-07 DIAGNOSIS — F419 Anxiety disorder, unspecified: Principal | ICD-10-CM

## 2018-03-07 DIAGNOSIS — F329 Major depressive disorder, single episode, unspecified: Secondary | ICD-10-CM

## 2018-03-09 NOTE — Telephone Encounter (Signed)
Please advise 

## 2018-03-13 NOTE — Telephone Encounter (Signed)
Pt aware that reason for refusal is that she needs to contact provider first. She will send ABC msg through myChart,

## 2018-03-13 NOTE — Telephone Encounter (Signed)
Pt states her pharamcy notified her paxil refill was denied. Pt inquiring why & requesting a refill. [phone]

## 2018-03-18 ENCOUNTER — Encounter: Payer: Self-pay | Admitting: Obstetrics and Gynecology

## 2018-03-18 ENCOUNTER — Other Ambulatory Visit: Payer: Self-pay | Admitting: Obstetrics and Gynecology

## 2018-03-18 DIAGNOSIS — F419 Anxiety disorder, unspecified: Principal | ICD-10-CM

## 2018-03-18 DIAGNOSIS — F329 Major depressive disorder, single episode, unspecified: Secondary | ICD-10-CM

## 2018-03-18 MED ORDER — PAROXETINE HCL 20 MG PO TABS: 20.0000 mg | ORAL_TABLET | Freq: Every day | ORAL | 1 refills | Status: DC

## 2018-03-18 NOTE — Progress Notes (Signed)
Rx RF. Taking 20 mg daily instead of 10 mg. Doing well.

## 2018-03-24 ENCOUNTER — Ambulatory Visit: Payer: Self-pay

## 2018-05-16 ENCOUNTER — Other Ambulatory Visit: Payer: Self-pay | Admitting: Obstetrics and Gynecology

## 2018-05-16 DIAGNOSIS — F329 Major depressive disorder, single episode, unspecified: Secondary | ICD-10-CM

## 2018-05-16 DIAGNOSIS — F419 Anxiety disorder, unspecified: Principal | ICD-10-CM

## 2018-07-06 DIAGNOSIS — E669 Obesity, unspecified: Secondary | ICD-10-CM | POA: Insufficient documentation

## 2018-07-19 ENCOUNTER — Other Ambulatory Visit: Payer: Self-pay | Admitting: Obstetrics and Gynecology

## 2018-07-19 DIAGNOSIS — F419 Anxiety disorder, unspecified: Principal | ICD-10-CM

## 2018-07-19 DIAGNOSIS — F329 Major depressive disorder, single episode, unspecified: Secondary | ICD-10-CM

## 2018-07-28 ENCOUNTER — Other Ambulatory Visit: Payer: Self-pay | Admitting: Obstetrics and Gynecology

## 2018-07-29 NOTE — Telephone Encounter (Signed)
Please advise 

## 2018-10-23 ENCOUNTER — Other Ambulatory Visit: Payer: Self-pay | Admitting: Obstetrics and Gynecology

## 2018-10-23 DIAGNOSIS — F32A Depression, unspecified: Secondary | ICD-10-CM

## 2018-10-23 DIAGNOSIS — F329 Major depressive disorder, single episode, unspecified: Secondary | ICD-10-CM

## 2018-10-23 NOTE — Telephone Encounter (Signed)
Please advise 

## 2018-11-16 ENCOUNTER — Encounter: Payer: Self-pay | Admitting: Obstetrics and Gynecology

## 2018-11-25 ENCOUNTER — Other Ambulatory Visit: Payer: Self-pay | Admitting: Obstetrics and Gynecology

## 2018-11-25 DIAGNOSIS — F419 Anxiety disorder, unspecified: Secondary | ICD-10-CM

## 2018-11-25 DIAGNOSIS — F329 Major depressive disorder, single episode, unspecified: Secondary | ICD-10-CM

## 2018-11-25 NOTE — Telephone Encounter (Signed)
Please advise 

## 2018-12-06 ENCOUNTER — Encounter: Payer: Self-pay | Admitting: Obstetrics and Gynecology

## 2018-12-06 ENCOUNTER — Other Ambulatory Visit: Payer: Self-pay | Admitting: Obstetrics and Gynecology

## 2018-12-06 DIAGNOSIS — F419 Anxiety disorder, unspecified: Secondary | ICD-10-CM

## 2018-12-06 DIAGNOSIS — F329 Major depressive disorder, single episode, unspecified: Secondary | ICD-10-CM

## 2018-12-07 MED ORDER — PAROXETINE HCL 20 MG PO TABS: 20.0000 mg | ORAL_TABLET | Freq: Every day | ORAL | 0 refills | Status: DC

## 2018-12-07 NOTE — Telephone Encounter (Signed)
Please advise 

## 2019-01-01 ENCOUNTER — Other Ambulatory Visit: Payer: Self-pay | Admitting: Obstetrics and Gynecology

## 2019-01-01 DIAGNOSIS — F329 Major depressive disorder, single episode, unspecified: Secondary | ICD-10-CM

## 2019-01-01 DIAGNOSIS — F419 Anxiety disorder, unspecified: Secondary | ICD-10-CM

## 2019-01-06 ENCOUNTER — Ambulatory Visit: Payer: Medicaid Other | Admitting: Obstetrics and Gynecology

## 2019-01-13 ENCOUNTER — Ambulatory Visit: Payer: Medicaid Other | Admitting: Obstetrics and Gynecology

## 2019-01-27 ENCOUNTER — Other Ambulatory Visit: Payer: Self-pay | Admitting: Obstetrics and Gynecology

## 2019-01-27 DIAGNOSIS — F329 Major depressive disorder, single episode, unspecified: Secondary | ICD-10-CM

## 2019-01-27 DIAGNOSIS — F32A Depression, unspecified: Secondary | ICD-10-CM

## 2019-01-27 NOTE — Telephone Encounter (Signed)
Please advise. Annual schedule for 9/14.

## 2019-02-02 DIAGNOSIS — R87612 Low grade squamous intraepithelial lesion on cytologic smear of cervix (LGSIL): Secondary | ICD-10-CM

## 2019-02-02 HISTORY — DX: Low grade squamous intraepithelial lesion on cytologic smear of cervix (LGSIL): R87.612

## 2019-02-15 ENCOUNTER — Other Ambulatory Visit: Payer: Self-pay

## 2019-02-15 ENCOUNTER — Ambulatory Visit (INDEPENDENT_AMBULATORY_CARE_PROVIDER_SITE_OTHER): Payer: Medicaid Other | Admitting: Obstetrics and Gynecology

## 2019-02-15 ENCOUNTER — Encounter: Payer: Self-pay | Admitting: Obstetrics and Gynecology

## 2019-02-15 ENCOUNTER — Other Ambulatory Visit (HOSPITAL_COMMUNITY)
Admission: RE | Admit: 2019-02-15 | Discharge: 2019-02-15 | Disposition: A | Discharge status: Home / Self Care | Payer: Medicaid Other | Source: Ambulatory Visit | Attending: Obstetrics and Gynecology | Admitting: Obstetrics and Gynecology

## 2019-02-15 VITALS — BP 110/80 | Ht 64.0 in | Wt 239.0 lb

## 2019-02-15 DIAGNOSIS — Z0000 Encounter for general adult medical examination without abnormal findings: Secondary | ICD-10-CM

## 2019-02-15 DIAGNOSIS — Z1239 Encounter for other screening for malignant neoplasm of breast: Secondary | ICD-10-CM

## 2019-02-15 DIAGNOSIS — Z1151 Encounter for screening for human papillomavirus (HPV): Secondary | ICD-10-CM

## 2019-02-15 DIAGNOSIS — Z23 Encounter for immunization: Secondary | ICD-10-CM

## 2019-02-15 DIAGNOSIS — F419 Anxiety disorder, unspecified: Secondary | ICD-10-CM

## 2019-02-15 DIAGNOSIS — Z124 Encounter for screening for malignant neoplasm of cervix: Secondary | ICD-10-CM | POA: Diagnosis not present

## 2019-02-15 DIAGNOSIS — L02213 Cutaneous abscess of chest wall: Secondary | ICD-10-CM

## 2019-02-15 DIAGNOSIS — Z01419 Encounter for gynecological examination (general) (routine) without abnormal findings: Secondary | ICD-10-CM

## 2019-02-15 DIAGNOSIS — F329 Major depressive disorder, single episode, unspecified: Secondary | ICD-10-CM

## 2019-02-15 MED ORDER — PAROXETINE HCL 20 MG PO TABS: ORAL_TABLET | ORAL | 3 refills | Status: DC

## 2019-02-15 MED ORDER — DOXYCYCLINE HYCLATE 100 MG PO CAPS: 100.0000 mg | ORAL_CAPSULE | Freq: Two times a day (BID) | ORAL | 0 refills | Status: DC

## 2019-02-15 NOTE — Patient Instructions (Signed)
I value your feedback and entrusting us with your care. If you get a [facility] patient survey, I would appreciate you taking the time to let us know about your experience today. Thank you! 

## 2019-02-15 NOTE — Progress Notes (Signed)
PCP:  Chad Cordial, PA-C   Chief Complaint  Patient presents with  . Gynecologic Exam    cyst under left breast that is very painful, red and swollen  . Immunizations    flu shot today     HPI:      [name] is a 43 y.o. [phone] who LMP was No LMP recorded. Patient has had a hysterectomy., presents today for her annual examination.  Her menses are absent due to hyst/RSO.   Dysmenorrhea none. She does not have intermenstrual bleeding. Has hot flashes and bad night sweats. Hx of migraines about 3 times yearly now, lasting about a week. Takes fioricet prn sx with relief.    Sex activity: not sexually active. No vag sx. Last Pap: January 03, 2016  Results were: no abnormalities /neg HPV DNA  Hx of STDs: none  Last mammogram: not recently There is no FH of breast cancer. There is a FH of ovarian cancer in her MGM. Pt's mom is BRCA 1 and 2 neg. Pt is MyRisk neg 8/17.  The patient does do self-breast exams.  Pt notes painful cyst under LT breast for a few days. Had probable LT breast cyst 8/19 that "popped" on its own last yr so pt never went for mammogram or u/s. Sx resolved until recurrence a few days ago. Area is red, painful, hot to touch. No fevers/chills. Using warm and cold compresses. No d/c yet.  Tobacco use: The patient denies current or previous tobacco use. Alcohol use: none No drug use.  Exercise: not active  She does get adequate calcium and Vitamin D in her diet.  Low Vit D 01/2016, taking supp now.   Pt also on paxil 20 mg (up from 10 mg last yr) for anxiety/depression sx with sx relief.  Wants to cont meds. No side effects.    Past Medical History:  Diagnosis Date  . Abdominal hernia   . Anxiety   . Constipation 08/15/2014  . Depression   . Esophageal reflux 08/15/2014  . Family history of ovarian cancer 04/21/2012   MGM; Mom is BRCA/BART neg.  . Genetic testing of female 01/2016   MYRISK neg  . Hiatal hernia 08/15/2014  . History of  Papanicolaou smear of cervix 04/21/2012   -/-  . Hyperlipidemia 04/21/2012  . Migraine    menstrual    Past Surgical History:  Procedure Laterality Date  . ABDOMINAL HYSTERECTOMY  05/14/2013   AMS; RSO; LT salpingectomy  . CESAREAN SECTION  11/12/2011   twins; PJR  . COLPOSCOPY  2004   PJR  . KNEE ARTHROSCOPY     RIGHT  . TONSILLECTOMY AND ADENOIDECTOMY    . TUBAL LIGATION      Family History  Problem Relation Age of Onset  . Hypertension Mother   . Cancer Father 1       MELANOMA  . Cancer Maternal Grandmother 69       OVARY  . Breast cancer Paternal Grandmother 34       BRAIN    Social History   Socioeconomic History  . Marital status: Married    Spouse name: Not on file  . Number of children: Not on file  . Years of education: Not on file  . Highest education level: Not on file  Occupational History  . Not on file  Social Needs  . Financial resource strain: Not on file  . Food insecurity    Worry: Not on file  Inability: Not on file  . Transportation needs    Medical: Not on file    Non-medical: Not on file  Tobacco Use  . Smoking status: Never Smoker  . Smokeless tobacco: Never Used  Substance and Sexual Activity  . Alcohol use: Yes    Comment: OCC  . Drug use: No  . Sexual activity: Yes    Partners: Male    Birth control/protection: Surgical    Comment: Hysterectomy  Lifestyle  . Physical activity    Days per week: Not on file    Minutes per session: Not on file  . Stress: Not on file  Relationships  . Social Herbalist on phone: Not on file    Gets together: Not on file    Attends religious service: Not on file    Active member of club or organization: Not on file    Attends meetings of clubs or organizations: Not on file    Relationship status: Not on file  . Intimate partner violence    Fear of current or ex partner: Not on file    Emotionally abused: Not on file    Physically abused: Not on file    Forced sexual  activity: Not on file  Other Topics Concern  . Not on file  Social History Narrative  . Not on file    Outpatient Medications Prior to Visit  Medication Sig Dispense Refill  . ALPRAZolam (XANAX) 0.25 MG tablet TAKE 1 TABLET BY MOUTH AT BEDTIME AS NEEDED FOR ANXIETY 30 tablet 0  . Butalbital-APAP-Caffeine 50-325-40 MG capsule Take 1-2 capsules by mouth every 6 (six) hours as needed for headache. 20 capsule 0  . PARoxetine (PAXIL) 20 MG tablet TAKE 1 TABLET BY MOUTH EVERY DAY **NEED APPT** 30 tablet 0   No facility-administered medications prior to visit.      ROS:  Review of Systems  Constitutional: Negative for fatigue, fever and unexpected weight change.  Respiratory: Negative for cough, shortness of breath and wheezing.   Cardiovascular: Negative for chest pain, palpitations and leg swelling.  Gastrointestinal: Negative for blood in stool, constipation, diarrhea, nausea and vomiting.  Endocrine: Negative for cold intolerance, heat intolerance and polyuria.  Genitourinary: Negative for dyspareunia, dysuria, flank pain, frequency, genital sores, hematuria, menstrual problem, pelvic pain, urgency, vaginal bleeding, vaginal discharge and vaginal pain.  Musculoskeletal: Negative for back pain, joint swelling and myalgias.  Skin: Negative for rash.  Neurological: Positive for headaches. Negative for dizziness, syncope, light-headedness and numbness.  Hematological: Negative for adenopathy.  Psychiatric/Behavioral: Negative for agitation, confusion, dysphoric mood, sleep disturbance and suicidal ideas. The patient is not nervous/anxious.    BREAST: No symptoms   Objective: BP 110/80   Ht 5' 4"  (1.626 m)   Wt 239 lb (108.4 kg)   BMI 41.02 kg/m    Physical Exam Constitutional:      Appearance: She is well-developed.  Genitourinary:     Vulva, vagina and left adnexa normal.     No vaginal discharge, erythema or tenderness.     Cervix is absent.     Uterus is absent.     No  right or left adnexal mass present.     Right adnexa absent.     Right adnexa not tender.     Left adnexa not tender.     Genitourinary Comments: UTERUS/CX SURG REM  Neck:     Musculoskeletal: Normal range of motion.     Thyroid: No thyromegaly.  Cardiovascular:  Rate and Rhythm: Normal rate and regular rhythm.     Heart sounds: Normal heart sounds. No murmur.  Pulmonary:     Effort: Pulmonary effort is normal.     Breath sounds: Normal breath sounds.  Chest:     Breasts:        Right: No mass, nipple discharge, skin change or tenderness.        Left: Mass, skin change and tenderness present. No nipple discharge.    Abdominal:     Palpations: Abdomen is soft.     Tenderness: There is no abdominal tenderness. There is no guarding.  Musculoskeletal: Normal range of motion.  Neurological:     General: No focal deficit present.     Mental Status: She is alert and oriented to person, place, and time.     Cranial Nerves: No cranial nerve deficit.  Skin:    General: Skin is warm and dry.  Psychiatric:        Mood and Affect: Mood normal.        Behavior: Behavior normal.        Thought Content: Thought content normal.        Judgment: Judgment normal.  Vitals signs reviewed.     Assessment/Plan: Encounter for annual routine gynecological examination  Cervical cancer screening - Plan: Cytology - PAP  Screening for HPV (human papillomavirus) - Plan: Cytology - PAP  Screening for breast cancer--Pt to sched mammo  Anxiety and depression - Plan: PARoxetine (PAXIL) 20 MG tablet; Rx RF paxil. F/u prn.   Cutaneous abscess of chest wall - Plan: doxycycline (VIBRAMYCIN) 100 MG capsule; Small area, better to drain than I&D. Warm compresses/try abx since sx recurred. F/u prn.   Needs flu shot - Plan: Flu Vaccine QUAD 36+ mos IM (Fluarix, Quad PF)  Meds ordered this encounter  Medications  . doxycycline (VIBRAMYCIN) 100 MG capsule    Sig: Take 1 capsule (100 mg total) by mouth  2 (two) times daily for 10 days.    Dispense:  20 capsule    Refill:  0    Order Specific Question:   Supervising Provider    Answer:   Gae Dry [id number]  . PARoxetine (PAXIL) 20 MG tablet    Sig: TAKE 1 TABLET BY MOUTH EVERY DAY    Dispense:  90 tablet    Refill:  3    Order Specific Question:   Supervising Provider    Answer:   Gae Dry [[id number]]             GYN counsel breast self exam, mammography screening, menopause, adequate intake of calcium and vitamin D, diet and exercise     F/U  Return in about 1 year (around 02/15/2020).  Kim Schmidt B. Taryn Shellhammer, PA-C 02/15/2019 3:46 PM

## 2019-02-18 LAB — CYTOLOGY - PAP: HPV: NOT DETECTED

## 2019-02-19 ENCOUNTER — Encounter: Payer: Self-pay | Admitting: Obstetrics and Gynecology

## 2019-03-05 ENCOUNTER — Ambulatory Visit: Payer: Medicaid Other | Admitting: Obstetrics and Gynecology

## 2019-03-12 ENCOUNTER — Ambulatory Visit: Payer: Medicaid Other | Admitting: Obstetrics and Gynecology

## 2019-03-22 ENCOUNTER — Ambulatory Visit (INDEPENDENT_AMBULATORY_CARE_PROVIDER_SITE_OTHER): Payer: Medicaid Other | Admitting: Obstetrics and Gynecology

## 2019-03-22 ENCOUNTER — Other Ambulatory Visit: Payer: Self-pay

## 2019-03-22 ENCOUNTER — Encounter: Payer: Self-pay | Admitting: Obstetrics and Gynecology

## 2019-03-22 VITALS — BP 130/74 | HR 83 | Ht 63.0 in | Wt 240.0 lb

## 2019-03-22 DIAGNOSIS — Z1371 Encounter for nonprocreative screening for genetic disease carrier status: Secondary | ICD-10-CM | POA: Diagnosis not present

## 2019-03-22 DIAGNOSIS — R87612 Low grade squamous intraepithelial lesion on cytologic smear of cervix (LGSIL): Secondary | ICD-10-CM

## 2019-03-22 NOTE — Progress Notes (Signed)
Obstetrics & Gynecology Office Visit   Chief Complaint:  Chief Complaint  Patient presents with  . Colposcopy    History of Present Illness:Kim Schmidt is a 43 y.o. woman who presents today for continued surveillance for history of dysplasia. Last pap obtained on 02/15/2019 revealed LGSIL HPV negative pap.    Pap/Treatment History:  01/03/2016 NIL HPV negative 04/21/2012 NIL HPV negative 04/09/2011 NIL HPV negative 06/22/2008 NIL HPV negative 04/17/2016 NIL  Review of Systems: Review of Systems  All other systems reviewed and are negative.  Past Medical History:  Past Medical History:  Diagnosis Date  . Abdominal hernia   . Anxiety   . Constipation 08/15/2014  . Depression   . Esophageal reflux 08/15/2014  . Family history of ovarian cancer 04/21/2012   MGM; Mom is BRCA/BART neg.  . Genetic testing of female 01/2016   MYRISK neg  . Hiatal hernia 08/15/2014  . History of Papanicolaou smear of cervix 04/21/2012   -/-  . Hyperlipidemia 04/21/2012  . 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 Migraine    menstrual    Past Surgical History:  Past Surgical History:  Procedure Laterality Date  . ABDOMINAL HYSTERECTOMY  05/14/2013   AMS; RSO; LT salpingectomy  . CESAREAN SECTION  11/12/2011   twins; PJR  . COLPOSCOPY  2004   PJR  . KNEE ARTHROSCOPY     RIGHT  . TONSILLECTOMY AND ADENOIDECTOMY    . TUBAL LIGATION      Gynecologic History: No LMP recorded. Patient has had a hysterectomy.  Obstetric History: [id number]  Family History:  Family History  Problem Relation Age of Onset  . Hypertension Mother   . Cancer Father 78       MELANOMA  . Cancer Maternal Grandmother 85       OVARY  . Breast cancer Paternal Grandmother 68       BRAIN    Social History:  Social History   Socioeconomic History  . Marital status: Married    Spouse name: Not on file  . Number of children: Not on file  . Years of education: Not on file  . Highest  education level: Not on file  Occupational History  . Not on file  Social Needs  . Financial resource strain: Not on file  . Food insecurity    Worry: Not on file    Inability: Not on file  . Transportation needs    Medical: Not on file    Non-medical: Not on file  Tobacco Use  . Smoking status: Never Smoker  . Smokeless tobacco: Never Used  Substance and Sexual Activity  . Alcohol use: Yes    Comment: OCC  . Drug use: No  . Sexual activity: Yes    Partners: Male    Birth control/protection: Surgical    Comment: Hysterectomy  Lifestyle  . Physical activity    Days per week: Not on file    Minutes per session: Not on file  . Stress: Not on file  Relationships  . Social Herbalist on phone: Not on file    Gets together: Not on file    Attends religious service: Not on file    Active member of club or organization: Not on file    Attends meetings of clubs or organizations: Not on file    Relationship status: Not on file  . Intimate partner violence    Fear of current or ex partner:  Not on file    Emotionally abused: Not on file    Physically abused: Not on file    Forced sexual activity: Not on file  Other Topics Concern  . Not on file  Social History Narrative  . Not on file    Allergies:  Allergies  Allergen Reactions  . Sulfa Antibiotics Swelling    Medications: Prior to Admission medications   Medication Sig Start Date End Date Taking? Authorizing Provider  ALPRAZolam (XANAX) 0.25 MG tablet TAKE 1 TABLET BY MOUTH AT BEDTIME AS NEEDED FOR ANXIETY 2/35/57  Yes Copland, Deirdre Evener, PA-C  Butalbital-APAP-Caffeine 50-325-40 MG capsule Take 1-2 capsules by mouth every 6 (six) hours as needed for headache. 08/03/18  Yes Copland, Elmo Putt B, PA-C  PARoxetine (PAXIL) 20 MG tablet TAKE 1 TABLET BY MOUTH EVERY DAY 2/54/27  Yes Copland, Deirdre Evener, PA-C    Physical Exam Vitals:  Vitals:   03/22/19 1503  BP: 130/74  Pulse: 83   No LMP recorded. Patient has had  a hysterectomy.  General: NAD, well nourished, appears stated age 30: normocephalic, anicteric Neurologic: Grossly intact Psychiatric: mood appropriate, affect full  Female chaperone present for pelvic and breast  portions of the physical exam    Assessment: 43 y.o. [id number] follow up for LGSIL HPV negative pap 02/15/2019  Plan: Problem List Items Addressed This Visit    None    Visit Diagnoses    LGSIL on Pap smear of cervix    -  Primary      - Follow up pap smear in 12 months.  We discussed colposcopy is acceptable but no the preferred triage for this pap result, which would be repeat pap an contesting in 12 months.  - I had a lengthly discussion with Kimber Relic Myron  regarding the cause of dysplasia of the lower genital tract (including immunosuppression in the setting of HPV exposure and tobacco exposure). I explained the potential for progression to invasive malignancy, the recurrent nature of these lesions (and the need for close continued followup). Results of today's pap will dictate need for further evaluation and follow up per ASCCP guidelines..  - She is comfortable with the plan and had her questions answered.  - Return in about 1 year (around 03/21/2020) for annual.   Malachy Mood, MD, [place], [facility] 03/22/2019, 3:21 PM

## 2019-06-28 ENCOUNTER — Ambulatory Visit: Payer: Medicaid Other | Attending: Internal Medicine

## 2019-06-28 DIAGNOSIS — Z20822 Contact with and (suspected) exposure to covid-19: Secondary | ICD-10-CM

## 2019-06-29 LAB — NOVEL CORONAVIRUS, NAA: SARS-CoV-2, NAA: NOT DETECTED

## 2019-08-21 ENCOUNTER — Ambulatory Visit: Payer: Medicaid Other | Attending: Internal Medicine

## 2019-08-30 ENCOUNTER — Ambulatory Visit: Payer: Medicaid Other | Attending: Internal Medicine

## 2019-11-29 ENCOUNTER — Encounter: Payer: Self-pay | Admitting: Obstetrics and Gynecology

## 2020-02-15 ENCOUNTER — Other Ambulatory Visit: Payer: Self-pay | Admitting: Obstetrics and Gynecology

## 2020-02-15 DIAGNOSIS — L02213 Cutaneous abscess of chest wall: Secondary | ICD-10-CM

## 2020-02-15 DIAGNOSIS — F32A Depression, unspecified: Secondary | ICD-10-CM

## 2020-02-16 ENCOUNTER — Encounter: Payer: Self-pay | Admitting: Obstetrics and Gynecology

## 2020-02-16 ENCOUNTER — Other Ambulatory Visit: Payer: Self-pay | Admitting: Obstetrics and Gynecology

## 2020-02-16 DIAGNOSIS — G43919 Migraine, unspecified, intractable, without status migrainosus: Secondary | ICD-10-CM

## 2020-02-16 DIAGNOSIS — F32A Depression, unspecified: Secondary | ICD-10-CM

## 2020-02-16 MED ORDER — ALPRAZOLAM 0.25 MG PO TABS: 0.2500 mg | ORAL_TABLET | Freq: Two times a day (BID) | ORAL | 0 refills | Status: DC | PRN

## 2020-02-16 MED ORDER — PAROXETINE HCL 20 MG PO TABS: ORAL_TABLET | ORAL | 0 refills | Status: DC

## 2020-02-16 MED ORDER — BUTALBITAL-APAP-CAFFEINE 50-325-40 MG PO CAPS: 1.0000 | ORAL_CAPSULE | Freq: Four times a day (QID) | ORAL | 0 refills | Status: DC | PRN

## 2020-02-16 NOTE — Progress Notes (Signed)
Rx RF paxil till 10/21 annual

## 2020-02-16 NOTE — Telephone Encounter (Signed)
advise

## 2020-03-06 ENCOUNTER — Ambulatory Visit
Admission: EM | Admit: 2020-03-06 | Discharge: 2020-03-06 | Disposition: A | Discharge status: Home / Self Care | Payer: Medicaid Other | Attending: Emergency Medicine | Admitting: Emergency Medicine

## 2020-03-06 DIAGNOSIS — B372 Candidiasis of skin and nail: Secondary | ICD-10-CM

## 2020-03-06 MED ORDER — NYSTATIN 100000 UNIT/GM EX CREA: TOPICAL_CREAM | CUTANEOUS | 0 refills | Status: DC

## 2020-03-06 NOTE — ED Triage Notes (Signed)
Patient c/o rash x3 days that has continued to worsen. Possible shingles.

## 2020-03-06 NOTE — Discharge Instructions (Signed)
Use the Nystatin cream as directed.  Follow up with your primary care provider if your symptoms are not improving.    

## 2020-03-06 NOTE — ED Provider Notes (Signed)
[facility]    CSN: [id number] Arrival date & time: 03/06/20  1503      History   Chief Complaint Chief Complaint  Patient presents with  . Rash    HPI Kim Schmidt is a 44 y.o. female.   Patient presents with a rash under both breast x3 days, R>L.  She states the rash is tender and she is concerned about shingles.  She denies fever, chills, sore throat, cough, shortness of breath, vomiting, diarrhea, or other symptoms.  No treatments attempted at home.  Patient's medical history includes depression, anxiety, migraines.  The history is provided by the patient.    Past Medical History:  Diagnosis Date  . Abdominal hernia   . Anxiety   . Constipation 08/15/2014  . Depression   . Esophageal reflux 08/15/2014  . Family history of ovarian cancer 04/21/2012   MGM; Mom is BRCA/BART neg.  . Genetic testing of female 01/2016   MYRISK neg  . Hiatal hernia 08/15/2014  . History of Papanicolaou smear of cervix 04/21/2012   -/-  . Hyperlipidemia 04/21/2012  . 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 Migraine    menstrual    Patient Active Problem List   Diagnosis Date Noted  . BRCA negative 03/22/2019  . Family history of ovarian cancer 09/03/2017  . Anxiety and depression 09/03/2017  . Galactorrhea 09/03/2017  . Perimenopausal vasomotor symptoms 09/03/2017    Past Surgical History:  Procedure Laterality Date  . ABDOMINAL HYSTERECTOMY  05/14/2013   AMS; RSO; LT salpingectomy  . CESAREAN SECTION  11/12/2011   twins; PJR  . COLPOSCOPY  2004   PJR  . KNEE ARTHROSCOPY     RIGHT  . TONSILLECTOMY AND ADENOIDECTOMY    . TUBAL LIGATION      OB History    Gravida  3   Para  2   Term  2   Preterm      AB  1   Living  3     SAB  1   TAB      Ectopic      Multiple  1   Live Births  3            Home Medications    Prior to Admission medications   Medication Sig Start Date End Date Taking? Authorizing Provider  ALPRAZolam  (XANAX) 0.25 MG tablet Take 1 tablet (0.25 mg total) by mouth 2 (two) times daily as needed for anxiety. 0/93/81   Copland, Ginette Otto  Butalbital-APAP-Caffeine [phone] MG capsule Take 1-2 capsules by mouth every 6 (six) hours as needed for headache. 6/96/78   Copland, Elmo Putt B, PA-C  doxycycline (VIBRAMYCIN) 100 MG capsule TAKE 1 CAPSULE BY MOUTH TWICE A DAY FOR 10 DAYS 9/38/10   Copland, Alicia B, PA-C  nystatin cream (MYCOSTATIN) Apply to affected area 2 times daily 03/06/20   Sharion Balloon, NP  PARoxetine (PAXIL) 20 MG tablet TAKE 1 TABLET BY MOUTH EVERY DAY 1/75/10   Copland, Deirdre Evener, PA-C    Family History Family History  Problem Relation Age of Onset  . Hypertension Mother   . Cancer Father 62       MELANOMA  . Cancer Maternal Grandmother 10       OVARY  . Breast cancer Paternal Grandmother 65       BRAIN    Social History Social History   Tobacco Use  . Smoking status: Never Smoker  . Smokeless tobacco:  Never Used  Vaping Use  . Vaping Use: Never used  Substance Use Topics  . Alcohol use: Yes    Comment: OCC  . Drug use: No     Allergies   Sulfa antibiotics   Review of Systems Review of Systems  Constitutional: Negative for chills and fever.  HENT: Negative for ear pain and sore throat.   Eyes: Negative for pain and visual disturbance.  Respiratory: Negative for cough and shortness of breath.   Cardiovascular: Negative for chest pain and palpitations.  Gastrointestinal: Negative for abdominal pain and vomiting.  Genitourinary: Negative for dysuria and hematuria.  Musculoskeletal: Negative for arthralgias and back pain.  Skin: Positive for rash. Negative for color change.  Neurological: Negative for seizures and syncope.  All other systems reviewed and are negative.    Physical Exam Triage Vital Signs ED Triage Vitals [03/06/20 1549]  Enc Vitals Group     BP      Pulse      Resp      Temp      Temp src      SpO2      Weight      Height       Head Circumference      Peak Flow      Pain Score 8     Pain Loc      Pain Edu?      Excl. in [place]?    No data found.  Updated Vital Signs BP 124/82   Pulse 78   Temp 98.2 F (36.8 C)   Resp 16   SpO2 97%   Visual Acuity Right Eye Distance:   Left Eye Distance:   Bilateral Distance:    Right Eye Near:   Left Eye Near:    Bilateral Near:     Physical Exam Vitals and nursing note reviewed.  Constitutional:      General: She is not in acute distress.    Appearance: She is well-developed. She is obese. She is not ill-appearing.  HENT:     Head: Normocephalic and atraumatic.     Mouth/Throat:     Mouth: Mucous membranes are moist.  Eyes:     Conjunctiva/sclera: Conjunctivae normal.  Cardiovascular:     Rate and Rhythm: Normal rate and regular rhythm.     Heart sounds: No murmur heard.   Pulmonary:     Effort: Pulmonary effort is normal. No respiratory distress.     Breath sounds: Normal breath sounds.  Abdominal:     Palpations: Abdomen is soft.     Tenderness: There is no abdominal tenderness.  Musculoskeletal:     Cervical back: Neck supple.  Skin:    General: Skin is warm and dry.     Findings: Rash present.     Comments: Red papular, patchy rash under both breasts, R>L.    Neurological:     General: No focal deficit present.     Mental Status: She is alert and oriented to person, place, and time.     Gait: Gait normal.  Psychiatric:        Mood and Affect: Mood normal.        Behavior: Behavior normal.      UC Treatments / Results  Labs (all labs ordered are listed, but only abnormal results are displayed) Labs Reviewed - No data to display  EKG   Radiology No results found.  Procedures Procedures (including critical care time)  Medications Ordered in UC Medications -  No data to display  Initial Impression / Assessment and Plan / UC Course  I have reviewed the triage vital signs and the nursing notes.  Pertinent labs & imaging results  that were available during my care of the patient were reviewed by me and considered in my medical decision making (see chart for details).   Candidal dermatitis.  Treating with nystatin cream.  Instructed patient to follow-up with her PCP if her symptoms are not improving.  Patient agrees to plan of care.   Final Clinical Impressions(s) / UC Diagnoses   Final diagnoses:  Candidal dermatitis     Discharge Instructions     Use the Nystatin cream as directed.    Follow up with your primary care provider if your symptoms are not improving.       ED Prescriptions    Medication Sig Dispense Auth. Provider   nystatin cream (MYCOSTATIN) Apply to affected area 2 times daily 30 g Sharion Balloon, NP     PDMP not reviewed this encounter.   Sharion Balloon, NP 03/06/20 [phone]

## 2020-03-17 ENCOUNTER — Ambulatory Visit: Payer: Medicaid Other | Admitting: Obstetrics and Gynecology

## 2020-03-28 ENCOUNTER — Ambulatory Visit: Payer: Medicaid Other | Admitting: Obstetrics and Gynecology

## 2020-04-11 ENCOUNTER — Ambulatory Visit: Payer: Medicaid Other | Admitting: Obstetrics and Gynecology

## 2020-05-16 NOTE — Progress Notes (Deleted)
PCP:  Chad Cordial, PA-C   No chief complaint on file.    HPI:      [name] is a 44 y.o. [phone] who LMP was No LMP recorded. Patient has had a hysterectomy., presents today for her annual examination.  Her menses are absent due to hyst/RSO.   Dysmenorrhea none. She does not have intermenstrual bleeding. Has hot flashes and bad night sweats. Hx of migraines about 3 times yearly now, lasting about a week. Takes fioricet prn sx with relief.    Sex activity: not sexually active. No vag sx. Last Pap: 02/15/19  Results were: LGSIL/neg HPV DNA; repeat pap due today (no colpo indicated) Hx of STDs: none  Last mammogram: not recently There is no FH of breast cancer. There is a FH of ovarian cancer in her MGM. Pt's mom is BRCA 1 and 2 neg. Pt is MyRisk neg 8/17.  The patient does do self-breast exams.  Pt notes painful cyst under LT breast for a few days. Had probable LT breast cyst 8/19 that "popped" on its own last yr so pt never went for mammogram or u/s. Sx resolved until recurrence a few days ago. Area is red, painful, hot to touch. No fevers/chills. Using warm and cold compresses. No d/c yet.  Tobacco use: The patient denies current or previous tobacco use. Alcohol use: none No drug use.  Exercise: not active  She does get adequate calcium and Vitamin D in her diet.  Low Vit D 01/2016, taking supp now.   Pt also on paxil 20 mg (up from 10 mg last yr) for anxiety/depression sx with sx relief.  Wants to cont meds. No side effects.    Past Medical History:  Diagnosis Date  . Abdominal hernia   . Anxiety   . Constipation 08/15/2014  . Depression   . Esophageal reflux 08/15/2014  . Family history of ovarian cancer 04/21/2012   MGM; Mom is BRCA/BART neg.  . Genetic testing of female 01/2016   MYRISK neg  . Hiatal hernia 08/15/2014  . History of Papanicolaou smear of cervix 04/21/2012   -/-  . Hyperlipidemia 04/21/2012  . LGSIL on Pap smear of cervix  02/2019  . Migraine    menstrual    Past Surgical History:  Procedure Laterality Date  . ABDOMINAL HYSTERECTOMY  05/14/2013   AMS; RSO; LT salpingectomy  . CESAREAN SECTION  11/12/2011   twins; PJR  . COLPOSCOPY  2004   PJR  . KNEE ARTHROSCOPY     RIGHT  . TONSILLECTOMY AND ADENOIDECTOMY    . TUBAL LIGATION      Family History  Problem Relation Age of Onset  . Hypertension Mother   . Cancer Father 51       MELANOMA  . Cancer Maternal Grandmother 24       OVARY  . Breast cancer Paternal Grandmother 37       BRAIN    Social History   Socioeconomic History  . Marital status: Divorced    Spouse name: Not on file  . Number of children: Not on file  . Years of education: Not on file  . Highest education level: Not on file  Occupational History  . Not on file  Tobacco Use  . Smoking status: Never Smoker  . Smokeless tobacco: Never Used  Vaping Use  . Vaping Use: Never used  Substance and Sexual Activity  . Alcohol use: Yes    Comment: OCC  . Drug  use: No  . Sexual activity: Yes    Partners: Male    Birth control/protection: Surgical    Comment: Hysterectomy  Other Topics Concern  . Not on file  Social History Narrative  . Not on file   Social Determinants of Health   Financial Resource Strain: Not on file  Food Insecurity: Not on file  Transportation Needs: Not on file  Physical Activity: Not on file  Stress: Not on file  Social Connections: Not on file  Intimate Partner Violence: Not on file    Outpatient Medications Prior to Visit  Medication Sig Dispense Refill  . ALPRAZolam (XANAX) 0.25 MG tablet Take 1 tablet (0.25 mg total) by mouth 2 (two) times daily as needed for anxiety. 30 tablet 0  . Butalbital-APAP-Caffeine 50-325-40 MG capsule Take 1-2 capsules by mouth every 6 (six) hours as needed for headache. 20 capsule 0  . doxycycline (VIBRAMYCIN) 100 MG capsule TAKE 1 CAPSULE BY MOUTH TWICE A DAY FOR 10 DAYS 20 capsule 0  . nystatin cream  (MYCOSTATIN) Apply to affected area 2 times daily 30 g 0  . PARoxetine (PAXIL) 20 MG tablet TAKE 1 TABLET BY MOUTH EVERY DAY 90 tablet 0   No facility-administered medications prior to visit.     ROS:  Review of Systems  Constitutional: Negative for fatigue, fever and unexpected weight change.  Respiratory: Negative for cough, shortness of breath and wheezing.   Cardiovascular: Negative for chest pain, palpitations and leg swelling.  Gastrointestinal: Negative for blood in stool, constipation, diarrhea, nausea and vomiting.  Endocrine: Negative for cold intolerance, heat intolerance and polyuria.  Genitourinary: Negative for dyspareunia, dysuria, flank pain, frequency, genital sores, hematuria, menstrual problem, pelvic pain, urgency, vaginal bleeding, vaginal discharge and vaginal pain.  Musculoskeletal: Negative for back pain, joint swelling and myalgias.  Skin: Negative for rash.  Neurological: Positive for headaches. Negative for dizziness, syncope, light-headedness and numbness.  Hematological: Negative for adenopathy.  Psychiatric/Behavioral: Negative for agitation, confusion, dysphoric mood, sleep disturbance and suicidal ideas. The patient is not nervous/anxious.    BREAST: No symptoms   Objective: There were no vitals taken for this visit.   Physical Exam Constitutional:      Appearance: She is well-developed.  Genitourinary:     Vulva normal.     Genitourinary Comments: UTERUS/CX SURG REM     No vaginal discharge, erythema or tenderness.      Right Adnexa: absent.    Right Adnexa: not tender and no mass present.    Left Adnexa: not tender and no mass present.    Cervix is absent.     Uterus is absent.  Breasts:     Right: No mass, nipple discharge, skin change or tenderness.     Left: Mass, skin change and tenderness present. No nipple discharge.    Neck:     Thyroid: No thyromegaly.  Cardiovascular:     Rate and Rhythm: Normal rate and regular rhythm.      Heart sounds: Normal heart sounds. No murmur heard.   Pulmonary:     Effort: Pulmonary effort is normal.     Breath sounds: Normal breath sounds.  Chest:    Abdominal:     Palpations: Abdomen is soft.     Tenderness: There is no abdominal tenderness. There is no guarding.  Musculoskeletal:        General: Normal range of motion.     Cervical back: Normal range of motion.  Neurological:     General: No focal  deficit present.     Mental Status: She is alert and oriented to person, place, and time.     Cranial Nerves: No cranial nerve deficit.  Skin:    General: Skin is warm and dry.  Psychiatric:        Mood and Affect: Mood normal.        Behavior: Behavior normal.        Thought Content: Thought content normal.        Judgment: Judgment normal.  Vitals reviewed.     Assessment/Plan: Encounter for annual routine gynecological examination  Cervical cancer screening - Plan: Cytology - PAP  Screening for HPV (human papillomavirus) - Plan: Cytology - PAP  Screening for breast cancer--Pt to sched mammo  Anxiety and depression - Plan: PARoxetine (PAXIL) 20 MG tablet; Rx RF paxil. F/u prn.   Cutaneous abscess of chest wall - Plan: doxycycline (VIBRAMYCIN) 100 MG capsule; Small area, better to drain than I&D. Warm compresses/try abx since sx recurred. F/u prn.   Needs flu shot - Plan: Flu Vaccine QUAD 36+ mos IM (Fluarix, Quad PF)  No orders of the defined types were placed in this encounter.            GYN counsel breast self exam, mammography screening, menopause, adequate intake of calcium and vitamin D, diet and exercise     F/U  No follow-ups on file.  Katelynn Heidler B. Myeisha Kruser, PA-C 05/16/2020 4:51 PM

## 2020-05-17 ENCOUNTER — Ambulatory Visit: Payer: Medicaid Other | Admitting: Obstetrics and Gynecology

## 2020-05-26 ENCOUNTER — Other Ambulatory Visit: Payer: Self-pay | Admitting: Obstetrics and Gynecology

## 2020-05-26 DIAGNOSIS — F32A Depression, unspecified: Secondary | ICD-10-CM

## 2020-05-29 ENCOUNTER — Encounter: Payer: Self-pay | Admitting: Obstetrics and Gynecology

## 2020-05-30 ENCOUNTER — Other Ambulatory Visit: Payer: Self-pay | Admitting: Obstetrics and Gynecology

## 2020-05-30 DIAGNOSIS — F419 Anxiety disorder, unspecified: Secondary | ICD-10-CM

## 2020-05-31 ENCOUNTER — Other Ambulatory Visit: Payer: Self-pay | Admitting: Advanced Practice Midwife

## 2020-05-31 MED ORDER — PAROXETINE HCL 20 MG PO TABS: ORAL_TABLET | ORAL | 0 refills | Status: DC

## 2020-07-04 ENCOUNTER — Ambulatory Visit: Payer: Medicaid Other | Admitting: Obstetrics and Gynecology

## 2020-07-04 NOTE — Progress Notes (Deleted)
PCP:  Chad Cordial, PA-C   No chief complaint on file.    HPI:      [name] is a 45 y.o. [phone] who LMP was No LMP recorded. Patient has had a hysterectomy., presents today for her annual examination.  Her menses are absent due to hyst/RSO.   Dysmenorrhea none. She does not have intermenstrual bleeding. Has hot flashes and bad night sweats. Hx of migraines about 3 times yearly now, lasting about a week. Takes fioricet prn sx with relief.    Sex activity: not sexually active. No vag sx. Last Pap: 02/15/19  Results were: LGSIL /neg HPV DNA; repeat due this yr Hx of STDs: none  Last mammogram: not recently There is no FH of breast cancer. There is a FH of ovarian cancer in her MGM. Pt's mom is BRCA 1 and 2 neg. Pt is MyRisk neg 8/17.  The patient does do self-breast exams.  Pt notes painful cyst under LT breast for a few days. Had probable LT breast cyst 8/19 that "popped" on its own last yr so pt never went for mammogram or u/s. Sx resolved until recurrence a few days ago. Area is red, painful, hot to touch. No fevers/chills. Using warm and cold compresses. No d/c yet.  Tobacco use: The patient denies current or previous tobacco use. Alcohol use: none No drug use.  Exercise: not active  She does get adequate calcium and Vitamin D in her diet.  Low Vit D 01/2016, taking supp now.   Pt also on paxil 20 mg (up from 10 mg last yr) for anxiety/depression sx with sx relief.  Wants to cont meds. No side effects.    Past Medical History:  Diagnosis Date  . Abdominal hernia   . Anxiety   . Constipation 08/15/2014  . Depression   . Esophageal reflux 08/15/2014  . Family history of ovarian cancer 04/21/2012   MGM; Mom is BRCA/BART neg.  . Genetic testing of female 01/2016   MYRISK neg  . Hiatal hernia 08/15/2014  . History of Papanicolaou smear of cervix 04/21/2012   -/-  . Hyperlipidemia 04/21/2012  . 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 Migraine     menstrual    Past Surgical History:  Procedure Laterality Date  . ABDOMINAL HYSTERECTOMY  05/14/2013   AMS; RSO; LT salpingectomy  . CESAREAN SECTION  11/12/2011   twins; PJR  . COLPOSCOPY  2004   PJR  . KNEE ARTHROSCOPY     RIGHT  . TONSILLECTOMY AND ADENOIDECTOMY    . TUBAL LIGATION      Family History  Problem Relation Age of Onset  . Hypertension Mother   . Cancer Father 90       MELANOMA  . Cancer Maternal Grandmother 76       OVARY  . Breast cancer Paternal Grandmother 68       BRAIN    Social History   Socioeconomic History  . Marital status: Divorced    Spouse name: Not on file  . Number of children: Not on file  . Years of education: Not on file  . Highest education level: Not on file  Occupational History  . Not on file  Tobacco Use  . Smoking status: Never Smoker  . Smokeless tobacco: Never Used  Vaping Use  . Vaping Use: Never used  Substance and Sexual Activity  . Alcohol use: Yes    Comment: OCC  . Drug use: No  .  Sexual activity: Yes    Partners: Male    Birth control/protection: Surgical    Comment: Hysterectomy  Other Topics Concern  . Not on file  Social History Narrative  . Not on file   Social Determinants of Health   Financial Resource Strain: Not on file  Food Insecurity: Not on file  Transportation Needs: Not on file  Physical Activity: Not on file  Stress: Not on file  Social Connections: Not on file  Intimate Partner Violence: Not on file    Outpatient Medications Prior to Visit  Medication Sig Dispense Refill  . ALPRAZolam (XANAX) 0.25 MG tablet Take 1 tablet (0.25 mg total) by mouth 2 (two) times daily as needed for anxiety. 30 tablet 0  . Butalbital-APAP-Caffeine 50-325-40 MG capsule Take 1-2 capsules by mouth every 6 (six) hours as needed for headache. 20 capsule 0  . doxycycline (VIBRAMYCIN) 100 MG capsule TAKE 1 CAPSULE BY MOUTH TWICE A DAY FOR 10 DAYS 20 capsule 0  . nystatin cream (MYCOSTATIN) Apply to affected  area 2 times daily 30 g 0  . PARoxetine (PAXIL) 20 MG tablet TAKE 1 TABLET BY MOUTH EVERY DAY 90 tablet 0   No facility-administered medications prior to visit.     ROS:  Review of Systems  Constitutional: Negative for fatigue, fever and unexpected weight change.  Respiratory: Negative for cough, shortness of breath and wheezing.   Cardiovascular: Negative for chest pain, palpitations and leg swelling.  Gastrointestinal: Negative for blood in stool, constipation, diarrhea, nausea and vomiting.  Endocrine: Negative for cold intolerance, heat intolerance and polyuria.  Genitourinary: Negative for dyspareunia, dysuria, flank pain, frequency, genital sores, hematuria, menstrual problem, pelvic pain, urgency, vaginal bleeding, vaginal discharge and vaginal pain.  Musculoskeletal: Negative for back pain, joint swelling and myalgias.  Skin: Negative for rash.  Neurological: Positive for headaches. Negative for dizziness, syncope, light-headedness and numbness.  Hematological: Negative for adenopathy.  Psychiatric/Behavioral: Negative for agitation, confusion, dysphoric mood, sleep disturbance and suicidal ideas. The patient is not nervous/anxious.    BREAST: No symptoms   Objective: There were no vitals taken for this visit.   Physical Exam Constitutional:      Appearance: She is well-developed.  Genitourinary:     Vulva normal.     Genitourinary Comments: UTERUS/CX SURG REM     No vaginal discharge, erythema or tenderness.      Right Adnexa: absent.    Right Adnexa: not tender and no mass present.    Left Adnexa: not tender and no mass present.    Cervix is absent.     Uterus is absent.  Breasts:     Right: No mass, nipple discharge, skin change or tenderness.     Left: Mass, skin change and tenderness present. No nipple discharge.    Neck:     Thyroid: No thyromegaly.  Cardiovascular:     Rate and Rhythm: Normal rate and regular rhythm.     Heart sounds: Normal heart  sounds. No murmur heard.   Pulmonary:     Effort: Pulmonary effort is normal.     Breath sounds: Normal breath sounds.  Chest:    Abdominal:     Palpations: Abdomen is soft.     Tenderness: There is no abdominal tenderness. There is no guarding.  Musculoskeletal:        General: Normal range of motion.     Cervical back: Normal range of motion.  Neurological:     General: No focal deficit present.  Mental Status: She is alert and oriented to person, place, and time.     Cranial Nerves: No cranial nerve deficit.  Skin:    General: Skin is warm and dry.  Psychiatric:        Mood and Affect: Mood normal.        Behavior: Behavior normal.        Thought Content: Thought content normal.        Judgment: Judgment normal.  Vitals reviewed.     Assessment/Plan: Encounter for annual routine gynecological examination  Cervical cancer screening - Plan: Cytology - PAP  Screening for HPV (human papillomavirus) - Plan: Cytology - PAP  Screening for breast cancer--Pt to sched mammo  Anxiety and depression - Plan: PARoxetine (PAXIL) 20 MG tablet; Rx RF paxil. F/u prn.   Cutaneous abscess of chest wall - Plan: doxycycline (VIBRAMYCIN) 100 MG capsule; Small area, better to drain than I&D. Warm compresses/try abx since sx recurred. F/u prn.   Needs flu shot - Plan: Flu Vaccine QUAD 36+ mos IM (Fluarix, Quad PF)  No orders of the defined types were placed in this encounter.            GYN counsel breast self exam, mammography screening, menopause, adequate intake of calcium and vitamin D, diet and exercise     F/U  No follow-ups on file.  [place] B. Iliyana Convey, PA-C 07/04/2020 11:32 AM

## 2020-08-07 ENCOUNTER — Other Ambulatory Visit: Payer: Self-pay

## 2020-08-07 ENCOUNTER — Ambulatory Visit (INDEPENDENT_AMBULATORY_CARE_PROVIDER_SITE_OTHER): Payer: Medicaid Other

## 2020-08-07 ENCOUNTER — Ambulatory Visit
Admission: RE | Admit: 2020-08-07 | Discharge: 2020-08-07 | Disposition: A | Discharge status: Home / Self Care | Payer: Medicaid Other | Source: Ambulatory Visit | Attending: Emergency Medicine | Admitting: Emergency Medicine

## 2020-08-07 VITALS — BP 126/84 | HR 82 | Temp 98.0°F | Resp 18 | Ht 64.0 in | Wt 195.0 lb

## 2020-08-07 DIAGNOSIS — M25562 Pain in left knee: Secondary | ICD-10-CM

## 2020-08-07 DIAGNOSIS — S76302A Unspecified injury of muscle, fascia and tendon of the posterior muscle group at thigh level, left thigh, initial encounter: Secondary | ICD-10-CM

## 2020-08-07 DIAGNOSIS — W182XXA Fall in (into) shower or empty bathtub, initial encounter: Secondary | ICD-10-CM

## 2020-08-07 MED ORDER — HYDROCODONE-ACETAMINOPHEN 5-325 MG PO TABS: 1.0000 | ORAL_TABLET | Freq: Four times a day (QID) | ORAL | 0 refills | Status: DC | PRN

## 2020-08-07 MED ORDER — IBUPROFEN 600 MG PO TABS: 600.0000 mg | ORAL_TABLET | Freq: Four times a day (QID) | ORAL | 0 refills | Status: DC | PRN

## 2020-08-07 MED ORDER — HYDROCODONE-ACETAMINOPHEN 10-325 MG PO TABS: 1.0000 | ORAL_TABLET | Freq: Four times a day (QID) | ORAL | 0 refills | Status: AC | PRN

## 2020-08-07 NOTE — Discharge Instructions (Signed)
Wear the knee immobilizer when you are up walking and use the crutches to help you with balance to prevent further injury.  Take the ibuprofen, 600 mg every 6 hours with food, as needed for mild to moderate pain.  Use the Norco as needed for severe pain.  Do not take this with your Fioricet.  Call [facility] clinic orthopedics tomorrow to schedule appointment to be evaluated for possible hamstring tear.

## 2020-08-07 NOTE — ED Provider Notes (Signed)
[facility]    CSN: [id number] Arrival date & time: 08/07/20  1559      History   Chief Complaint Chief Complaint  Patient presents with  . Fall  . Knee Pain    HPI Kim Schmidt is a 45 y.o. female.   HPI   45 year old female here for evaluation of left knee pain.  Patient reports that she fell yesterday getting out of her shower and hyperextended her left knee.  She reports that when she landed on the ground she was in a lot of pain and she felt her leg get very very hot.  She reports that when she was able to get back up her leg hyperextended again.  Patient reports that she has been able to bear weight and has been walking.  She does state that she has had some tingling in her toes.  Past Medical History:  Diagnosis Date  . Abdominal hernia   . Anxiety   . Constipation 08/15/2014  . Depression   . Esophageal reflux 08/15/2014  . Family history of ovarian cancer 04/21/2012   MGM; Mom is BRCA/BART neg.  . Genetic testing of female 01/2016   MYRISK neg  . Hiatal hernia 08/15/2014  . History of Papanicolaou smear of cervix 04/21/2012   -/-  . Hyperlipidemia 04/21/2012  . 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 Migraine    menstrual    Patient Active Problem List   Diagnosis Date Noted  . BRCA negative 03/22/2019  . Family history of ovarian cancer 09/03/2017  . Anxiety and depression 09/03/2017  . Galactorrhea 09/03/2017  . Perimenopausal vasomotor symptoms 09/03/2017    Past Surgical History:  Procedure Laterality Date  . ABDOMINAL HYSTERECTOMY  05/14/2013   AMS; RSO; LT salpingectomy  . CESAREAN SECTION  11/12/2011   twins; PJR  . COLPOSCOPY  2004   PJR  . KNEE ARTHROSCOPY     RIGHT  . TONSILLECTOMY AND ADENOIDECTOMY    . TUBAL LIGATION      OB History    Gravida  3   Para  2   Term  2   Preterm      AB  1   Living  3     SAB  1   IAB      Ectopic      Multiple  1   Live Births  3             Home Medications    Prior to Admission medications   Medication Sig Start Date End Date Taking? Authorizing Provider  ALPRAZolam (XANAX) 0.25 MG tablet Take 1 tablet (0.25 mg total) by mouth 2 (two) times daily as needed for anxiety. 01/12/56  Yes Copland, Ginette Otto  Butalbital-APAP-Caffeine 26-203-55 MG capsule Take 1-2 capsules by mouth every 6 (six) hours as needed for headache. 9/74/16  Yes Copland, Deirdre Evener, PA-C  HYDROcodone-acetaminophen (NORCO) 5-325 MG tablet Take 1 tablet by mouth every 6 (six) hours as needed for moderate pain. 08/07/20  Yes Margarette Canada, NP  ibuprofen (ADVIL) 600 MG tablet Take 1 tablet (600 mg total) by mouth every 6 (six) hours as needed. 08/07/20  Yes Margarette Canada, NP  PARoxetine (PAXIL) 20 MG tablet TAKE 1 TABLET BY MOUTH EVERY DAY 38/45/36  Yes Copland, Deirdre Evener, PA-C    Family History Family History  Problem Relation Age of Onset  . Hypertension Mother   . Cancer Father 32  MELANOMA  . Cancer Maternal Grandmother 35       OVARY  . Breast cancer Paternal Grandmother 17       BRAIN    Social History Social History   Tobacco Use  . Smoking status: Never Smoker  . Smokeless tobacco: Never Used  Vaping Use  . Vaping Use: Never used  Substance Use Topics  . Alcohol use: Yes    Comment: OCC  . Drug use: No     Allergies   Sulfa antibiotics   Review of Systems Review of Systems  Constitutional: Negative for activity change and appetite change.  Musculoskeletal: Positive for arthralgias, joint swelling and myalgias.  Skin: Positive for color change.  Neurological: Negative for weakness and numbness.     Physical Exam Triage Vital Signs ED Triage Vitals  Enc Vitals Group     BP 08/07/20 1651 126/84     Pulse Rate 08/07/20 1651 82     Resp 08/07/20 1651 18     Temp 08/07/20 1651 98 F (36.7 C)     Temp Source 08/07/20 1651 Oral     SpO2 08/07/20 1651 99 %     Weight 08/07/20 1649 195 lb (88.5 kg)     Height 08/07/20  1649 5' 4"  (1.626 m)     Head Circumference --      Peak Flow --      Pain Score 08/07/20 1649 10     Pain Loc --      Pain Edu? --      Excl. in [place]? --    No data found.  Updated Vital Signs BP 126/84 (BP Location: Left Arm)   Pulse 82   Temp 98 F (36.7 C) (Oral)   Resp 18   Ht 5' 4"  (1.626 m)   Wt 195 lb (88.5 kg)   SpO2 99%   BMI 33.47 kg/m   Visual Acuity Right Eye Distance:   Left Eye Distance:   Bilateral Distance:    Right Eye Near:   Left Eye Near:    Bilateral Near:     Physical Exam Vitals and nursing note reviewed.  Constitutional:      General: She is not in acute distress.    Appearance: Normal appearance. She is ill-appearing.  HENT:     Head: Normocephalic and atraumatic.  Musculoskeletal:        General: Swelling, tenderness and deformity present. Normal range of motion.  Skin:    General: Skin is warm and dry.     Capillary Refill: Capillary refill takes less than 2 seconds.     Findings: Bruising present.  Neurological:     General: No focal deficit present.     Mental Status: She is alert and oriented to person, place, and time.  Psychiatric:        Mood and Affect: Mood normal.        Behavior: Behavior normal.        Thought Content: Thought content normal.        Judgment: Judgment normal.      UC Treatments / Results  Labs (all labs ordered are listed, but only abnormal results are displayed) Labs Reviewed - No data to display  EKG   Radiology DG Knee Complete 4 Views Left  Result Date: 08/07/2020 CLINICAL DATA:  [place] yesterday, hyperextension left knee, pain EXAM: LEFT KNEE - COMPLETE 4+ VIEW COMPARISON:  None. FINDINGS: Frontal, oblique, lateral, and sunrise views of the left knee are obtained. No  fracture, subluxation, or dislocation. Joint spaces are well preserved. Minimal patellar spurring. No joint effusion. Soft tissues are unremarkable. IMPRESSION: 1. Mild patellofemoral osteoarthritis.  No acute fracture.  Electronically Signed   By: Randa Ngo M.D.   On: 08/07/2020 17:39    Procedures Procedures (including critical care time)  Medications Ordered in UC Medications - No data to display  Initial Impression / Assessment and Plan / UC Course  I have reviewed the triage vital signs and the nursing notes.  Pertinent labs & imaging results that were available during my care of the patient were reviewed by me and considered in my medical decision making (see chart for details).   Patient is a very pleasant 45 year old female who appears to be in pain and is here for evaluation of pain in her left knee.  Patient reports that the pain is actually in the distal posterior aspect of her left thigh.  Patient has no tenderness to palpation of the patella, the quadriceps complex, the joint line, popliteal fossa, and has a negative anterior posterior drawer test.  DP PT pulses are 2+.  Patient has full range of motion of her knee though she does start to develop pain when at near full extension of her left knee.  There is a palpable defect in the medial aspect of the left hamstrings complex.  Below the defect is an area of ecchymosis that extends inferiorly.  Patient is able to stand and bear weight.  Patient's exam is suspicious for a muscle tear.  X-ray of left knee obtained at triage.  Radiology interpretation of left knee radiographs is that patient has some mild patellofemoral osteoarthritis but no acute fracture or dislocation.  Will immobilize patient in knee immobilizer and out fit with crutches for assisted weightbearing.  Patient has seen orthopedics after normal clinic.  We will have her call for appointment and follow-up tomorrow.   Final Clinical Impressions(s) / UC Diagnoses   Final diagnoses:  Hamstring injury, left, initial encounter     Discharge Instructions     Wear the knee immobilizer when you are up walking and use the crutches to help you with balance to prevent further  injury.  Take the ibuprofen, 600 mg every 6 hours with food, as needed for mild to moderate pain.  Use the Norco as needed for severe pain.  Do not take this with your Fioricet.  Call [facility] clinic orthopedics tomorrow to schedule appointment to be evaluated for possible hamstring tear.    ED Prescriptions    Medication Sig Dispense Auth. Provider   ibuprofen (ADVIL) 600 MG tablet Take 1 tablet (600 mg total) by mouth every 6 (six) hours as needed. 30 tablet Margarette Canada, NP   HYDROcodone-acetaminophen (NORCO) 5-325 MG tablet Take 1 tablet by mouth every 6 (six) hours as needed for moderate pain. 20 tablet Margarette Canada, NP     I have reviewed the PDMP during this encounter.   Margarette Canada, NP 08/07/20 1800

## 2020-08-07 NOTE — ED Triage Notes (Signed)
Patient c/o falling yesterday out of the shower and she hyper extended her left knee. She is c/o pain at there knee and hamstring area.

## 2020-08-22 ENCOUNTER — Ambulatory Visit: Payer: Medicaid Other | Admitting: Obstetrics and Gynecology

## 2020-08-29 ENCOUNTER — Other Ambulatory Visit: Payer: Self-pay | Admitting: Obstetrics and Gynecology

## 2020-08-29 ENCOUNTER — Encounter: Payer: Self-pay | Admitting: Obstetrics and Gynecology

## 2020-08-29 DIAGNOSIS — F419 Anxiety disorder, unspecified: Secondary | ICD-10-CM

## 2020-08-29 DIAGNOSIS — F32A Depression, unspecified: Secondary | ICD-10-CM

## 2020-08-31 ENCOUNTER — Other Ambulatory Visit: Payer: Self-pay | Admitting: Obstetrics and Gynecology

## 2020-08-31 DIAGNOSIS — F419 Anxiety disorder, unspecified: Secondary | ICD-10-CM

## 2020-08-31 DIAGNOSIS — F32A Depression, unspecified: Secondary | ICD-10-CM

## 2020-08-31 MED ORDER — PAROXETINE HCL 20 MG PO TABS: ORAL_TABLET | ORAL | 0 refills | Status: DC

## 2020-08-31 NOTE — Progress Notes (Signed)
Rx RF paxil till annual. Pt promises to come to appt or no more refills. Has no showed for several appts. Last seen 9/20

## 2020-09-07 ENCOUNTER — Ambulatory Visit: Payer: Medicaid Other | Admitting: Obstetrics and Gynecology

## 2020-09-25 ENCOUNTER — Other Ambulatory Visit: Payer: Self-pay | Admitting: Obstetrics and Gynecology

## 2020-09-25 ENCOUNTER — Ambulatory Visit: Payer: Medicaid Other | Admitting: Dermatology

## 2020-09-25 ENCOUNTER — Other Ambulatory Visit: Payer: Self-pay

## 2020-09-25 DIAGNOSIS — D1800 Hemangioma unspecified site: Secondary | ICD-10-CM

## 2020-09-25 DIAGNOSIS — L814 Other melanin hyperpigmentation: Secondary | ICD-10-CM

## 2020-09-25 DIAGNOSIS — D239 Other benign neoplasm of skin, unspecified: Secondary | ICD-10-CM

## 2020-09-25 DIAGNOSIS — L578 Other skin changes due to chronic exposure to nonionizing radiation: Secondary | ICD-10-CM

## 2020-09-25 DIAGNOSIS — D225 Melanocytic nevi of trunk: Secondary | ICD-10-CM | POA: Diagnosis not present

## 2020-09-25 DIAGNOSIS — Z1283 Encounter for screening for malignant neoplasm of skin: Secondary | ICD-10-CM

## 2020-09-25 DIAGNOSIS — F419 Anxiety disorder, unspecified: Secondary | ICD-10-CM

## 2020-09-25 DIAGNOSIS — D492 Neoplasm of unspecified behavior of bone, soft tissue, and skin: Secondary | ICD-10-CM

## 2020-09-25 DIAGNOSIS — D229 Melanocytic nevi, unspecified: Secondary | ICD-10-CM | POA: Diagnosis not present

## 2020-09-25 DIAGNOSIS — L720 Epidermal cyst: Secondary | ICD-10-CM | POA: Diagnosis not present

## 2020-09-25 DIAGNOSIS — L821 Other seborrheic keratosis: Secondary | ICD-10-CM

## 2020-09-25 HISTORY — DX: Other benign neoplasm of skin, unspecified: D23.9

## 2020-09-25 NOTE — Patient Instructions (Addendum)
Pre-Operative Instructions  You are scheduled for a surgical procedure at [facility]. We recommend you read the following instructions. If you have any questions or concerns, please call the office at [phone].  1. Shower and wash the entire body with soap and water the day of your surgery paying special attention to cleansing at and around the planned surgery site.  2. Avoid aspirin or aspirin containing products at least fourteen (14) days prior to your surgical procedure and for at least one week (7 Days) after your surgical procedure. If you take aspirin on a regular basis for heart disease or history of stroke or for any other reason, we may recommend you continue taking aspirin but please notify us if you take this on a regular basis. Aspirin can cause more bleeding to occur during surgery as well as prolonged bleeding and bruising after surgery.   3. Avoid other nonsteroidal pain medications at least one week prior to surgery and at least one week prior to your surgery. These include medications such as Ibuprofen (Motrin, Advil and Nuprin), Naprosyn, Voltaren, Relafen, etc. If medications are used for therapeutic reasons, please inform us as they can cause increased bleeding or prolonged bleeding during and bruising after surgical procedures.   4. Please advise [place] if you are taking any "blood thinner" medications such as Coumadin or Dipyridamole or Plavix or similar medications. These cause increased bleeding and prolonged bleeding during procedures and bruising after surgical procedures. We may have to consider discontinuing these medications briefly prior to and shortly after your surgery if safe to do so.   5. Please inform us of all medications you are currently taking. All medications that are taken regularly should be taken the day of surgery as you always do. Nevertheless, we need to be informed of what medications you are taking prior to surgery to know whether they  will affect the procedure or cause any complications.   6. Please inform us of any medication allergies. Also inform us of whether you have allergies to Latex or rubber products or whether you have had any adverse reaction to Lidocaine or Epinephrine.  7. Please inform us of any prosthetic or artificial body parts such as artificial heart valve, joint replacements, etc., or similar condition that might require preoperative antibiotics.   8. We recommend avoidance of alcohol at least two weeks prior to surgery and continued avoidance for at least two weeks after surgery.   9. We recommend discontinuation of tobacco smoking at least two weeks prior to surgery and continued abstinence for at least two weeks after surgery.  10. Do not plan strenuous exercise, strenuous work or strenuous lifting for approximately four weeks after your surgery.   11. We request if you are unable to make your scheduled surgical appointment, please call us at least a week in advance or as soon as you are aware of a problem so that we can cancel or reschedule the appointment.   12. You MAY TAKE TYLENOL (acetaminophen) for pain as it is not a blood thinner.   13. PLEASE PLAN TO BE IN TOWN FOR TWO WEEKS FOLLOWING SURGERY, THIS IS IMPORTANT SO YOU CAN BE CHECKED FOR DRESSING CHANGES, SUTURE REMOVAL AND TO MONITOR FOR POSSIBLE COMPLICATIONS.           Wound Care Instructions  1. Cleanse wound gently with soap and water once a day then pat dry with clean gauze. Apply a thing coat of Petrolatum (petroleum jelly, "Vaseline") over the  wound (unless you have an allergy to this). We recommend that you use a new, sterile tube of Vaseline. Do not pick or remove scabs. Do not remove the yellow or white "healing tissue" from the base of the wound.  2. Cover the wound with fresh, clean, nonstick gauze and secure with paper tape. You may use Band-Aids in place of gauze and tape if the would is small enough, but would recommend  trimming much of the tape off as there is often too much. Sometimes Band-Aids can irritate the skin.  3. You should call the office for your biopsy report after 1 week if you have not already been contacted.  4. If you experience any problems, such as abnormal amounts of bleeding, swelling, significant bruising, significant pain, or evidence of infection, please call the office immediately.  5. FOR ADULT SURGERY PATIENTS: If you need something for pain relief you may take 1 extra strength Tylenol (acetaminophen) AND 2 Ibuprofen (200mg  each) together every 4 hours as needed for pain. (do not take these if you are allergic to them or if you have a reason you should not take them.) Typically, you may only need pain medication for 1 to 3 days.

## 2020-09-25 NOTE — Progress Notes (Deleted)
New Patient Visit  Subjective  Kim Schmidt is a 45 y.o. female who presents for the following: Annual Exam. The patient presents for Total-Body Skin Exam (TBSE) for skin cancer screening and mole check.  The following portions of the chart were reviewed this encounter and updated as appropriate:   Tobacco  Allergies  Meds  Problems  Med Hx  Surg Hx  Fam Hx     Review of Systems:  No other skin or systemic complaints except as noted in HPI or Assessment and Plan.  Objective  Well appearing patient in no apparent distress; mood and affect are within normal limits.  A full examination was performed including scalp, head, eyes, ears, nose, lips, neck, chest, axillae, abdomen, back, buttocks, bilateral upper extremities, bilateral lower extremities, hands, feet, fingers, toes, fingernails, and toenails. All findings within normal limits unless otherwise noted below.  Objective  Right back posterior waistline: 0.8 cm Subcutaneous papule/nodule with erythema and edema, tender to touch.   Objective  left upper back parspinal: 1.1 x 0.6 cm irregular macule    Assessment & Plan  Epidermal inclusion cyst Right back posterior waistline  Symptomatic cyst recommend surgery removal  Benign-appearing. Exam most consistent with an epidermal inclusion cyst. Discussed that a cyst is a benign growth that can grow over time and sometimes get irritated or inflamed. Recommend observation if it is not bothersome. Discussed option of surgical excision to remove it if it is growing, symptomatic, or other changes noted. Please call for new or changing lesions so they can be evaluated.    Neoplasm of skin left upper back parspinal  Epidermal / dermal shaving  Lesion diameter (cm):  1.1 Informed consent: discussed and consent obtained   Timeout: patient name, date of birth, surgical site, and procedure verified   Procedure prep:  Patient was prepped and draped in usual sterile  fashion Prep type:  Isopropyl alcohol Anesthesia: the lesion was anesthetized in a standard fashion   Anesthetic:  1% lidocaine w/ epinephrine 1-100,000 buffered w/ 8.4% NaHCO3 Hemostasis achieved with: pressure, aluminum chloride and electrodesiccation   Outcome: patient tolerated procedure well   Post-procedure details: sterile dressing applied and wound care instructions given   Dressing type: bandage and petrolatum    Specimen 1 - Surgical pathology Differential Diagnosis: R/O Dysplastic nevus   Check Margins: No 1.1 x 0.6 cm irregular macule  Skin cancer screening   Lentigines - Scattered tan macules - Due to sun exposure - Benign-appering, observe - Recommend daily broad spectrum sunscreen SPF 30+ to sun-exposed areas, reapply every 2 hours as needed. - Call for any changes  Seborrheic Keratoses - Stuck-on, waxy, tan-brown papules and/or plaques  - Benign-appearing - Discussed benign etiology and prognosis. - Observe - Call for any changes  Melanocytic Nevi - Tan-brown and/or pink-flesh-colored symmetric macules and papules - Benign appearing on exam today - Observation - Call clinic for new or changing moles - Recommend daily use of broad spectrum spf 30+ sunscreen to sun-exposed areas.   Hemangiomas - Red papules - Discussed benign nature - Observe - Call for any changes  Actinic Damage - Chronic condition, secondary to cumulative UV/sun exposure - diffuse scaly erythematous macules with underlying dyspigmentation - Recommend daily broad spectrum sunscreen SPF 30+ to sun-exposed areas, reapply every 2 hours as needed.  - Staying in the shade or wearing long sleeves, sun glasses (UVA+UVB protection) and wide brim hats (4-inch brim around the entire circumference of the hat) are also recommended for sun  protection.  - Call for new or changing lesions.  Skin cancer screening performed today.  Return in about 1 year (around 09/25/2021) for TBSE and  Cyst  surgery appointment.  IMarye Round, CMA, am acting as scribe for Sarina Ser, MD .  Documentation: I have reviewed the above documentation for accuracy and completeness, and I agree with the above.  Sarina Ser, MD

## 2020-09-26 ENCOUNTER — Encounter: Payer: Self-pay | Admitting: Dermatology

## 2020-09-26 NOTE — Progress Notes (Signed)
New Patient Visit  Subjective  Kim Schmidt is a 45 y.o. female who presents for the following: Annual Exam. Mole check. The patient presents for Total-Body Skin Exam (TBSE) for skin cancer screening and mole check.  The following portions of the chart were reviewed this encounter and updated as appropriate:   Tobacco  Allergies  Meds  Problems  Med Hx  Surg Hx  Fam Hx     Review of Systems:  No other skin or systemic complaints except as noted in HPI or Assessment and Plan.  Objective  Well appearing patient in no apparent distress; mood and affect are within normal limits.  A full examination was performed including scalp, head, eyes, ears, nose, lips, neck, chest, axillae, abdomen, back, buttocks, bilateral upper extremities, bilateral lower extremities, hands, feet, fingers, toes, fingernails, and toenails. All findings within normal limits unless otherwise noted below.  Objective  Right back posterior waistline: 0.8 cm Subcutaneous papule/nodule with erythema and edema, tender to touch.   Objective  left upper back parspinal: 1.1 x 0.6 cm irregular macule    Assessment & Plan  Epidermal inclusion cyst Right back posterior waistline  Symptomatic cyst recommend surgery removal  Benign-appearing. Exam most consistent with an epidermal inclusion cyst. Discussed that a cyst is a benign growth that can grow over time and sometimes get irritated or inflamed. Recommend observation if it is not bothersome. Discussed option of surgical excision to remove it if it is growing, symptomatic, or other changes noted. Please call for new or changing lesions so they can be evaluated.    Neoplasm of skin left upper back parspinal  Epidermal / dermal shaving  Lesion diameter (cm):  1.1 Informed consent: discussed and consent obtained   Timeout: patient name, date of birth, surgical site, and procedure verified   Procedure prep:  Patient was prepped and draped in  usual sterile fashion Prep type:  Isopropyl alcohol Anesthesia: the lesion was anesthetized in a standard fashion   Anesthetic:  1% lidocaine w/ epinephrine 1-100,000 buffered w/ 8.4% NaHCO3 Hemostasis achieved with: pressure, aluminum chloride and electrodesiccation   Outcome: patient tolerated procedure well   Post-procedure details: sterile dressing applied and wound care instructions given   Dressing type: bandage and petrolatum    Specimen 1 - Surgical pathology Differential Diagnosis: R/O Dysplastic nevus   Check Margins: No 1.1 x 0.6 cm irregular macule  Skin cancer screening  Lentigines - Scattered tan macules - Due to sun exposure - Benign-appering, observe - Recommend daily broad spectrum sunscreen SPF 30+ to sun-exposed areas, reapply every 2 hours as needed. - Call for any changes  Seborrheic Keratoses - Stuck-on, waxy, tan-brown papules and/or plaques  - Benign-appearing - Discussed benign etiology and prognosis. - Observe - Call for any changes  Melanocytic Nevi - Tan-brown and/or pink-flesh-colored symmetric macules and papules - Benign appearing on exam today - Observation - Call clinic for new or changing moles - Recommend daily use of broad spectrum spf 30+ sunscreen to sun-exposed areas.   Hemangiomas - Red papules - Discussed benign nature - Observe - Call for any changes  Actinic Damage - Chronic condition, secondary to cumulative UV/sun exposure - diffuse scaly erythematous macules with underlying dyspigmentation - Recommend daily broad spectrum sunscreen SPF 30+ to sun-exposed areas, reapply every 2 hours as needed.  - Staying in the shade or wearing long sleeves, sun glasses (UVA+UVB protection) and wide brim hats (4-inch brim around the entire circumference of the hat) are also recommended for  sun protection.  - Call for new or changing lesions.  Skin cancer screening performed today.  Return in about 1 year (around 09/25/2021) for TBSE and   Cyst surgery appointment.  IMarye Round, CMA, am acting as scribe for Sarina Ser, MD .  Documentation: I have reviewed the above documentation for accuracy and completeness, and I agree with the above.  Sarina Ser, MD

## 2020-09-27 NOTE — Progress Notes (Deleted)
PCP:  Chad Cordial, PA-C   No chief complaint on file.    HPI:      [name] is a 45 y.o. [phone] who LMP was No LMP recorded. Patient has had a hysterectomy., presents today for her annual examination.  Her menses are absent due to hyst/RSO.   Dysmenorrhea none. She does not have PMB. Has hot flashes and bad night sweats. Hx of migraines about 3 times yearly now, lasting about a week. Takes fioricet prn sx with relief.    Sex activity: not sexually active. No vag sx. Last Pap: 02/15/19  Results were: LGSIL /neg HPV DNA; repeat pap due today Hx of STDs: none  Last mammogram: not recently There is no FH of breast cancer. There is a FH of ovarian cancer in her MGM. Pt's mom is BRCA 1 and 2 neg. Pt is MyRisk neg 8/17.  The patient does do self-breast exams.  Pt notes painful cyst under LT breast for a few days. Had probable LT breast cyst 8/19 that "popped" on its own last yr so pt never went for mammogram or u/s. Sx resolved until recurrence a few days ago. Area is red, painful, hot to touch. No fevers/chills. Using warm and cold compresses. No d/c yet.  Tobacco use: The patient denies current or previous tobacco use. Alcohol use: none No drug use.  Exercise: not active  She does get adequate calcium and Vitamin D in her diet.  Low Vit D 01/2016, taking supp now.   Pt also on paxil 20 mg (up from 10 mg last yr) for anxiety/depression sx with sx relief.  Wants to cont meds. No side effects.    Past Medical History:  Diagnosis Date  . Abdominal hernia   . Anxiety   . Constipation 08/15/2014  . Depression   . Esophageal reflux 08/15/2014  . Family history of ovarian cancer 04/21/2012   MGM; Mom is BRCA/BART neg.  . Genetic testing of female 01/2016   MYRISK neg  . Hiatal hernia 08/15/2014  . History of Papanicolaou smear of cervix 04/21/2012   -/-  . Hyperlipidemia 04/21/2012  . 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 Migraine    menstrual     Past Surgical History:  Procedure Laterality Date  . ABDOMINAL HYSTERECTOMY  05/14/2013   [facility]; RSO; LT salpingectomy  . CESAREAN SECTION  11/12/2011   twins; [facility]  . COLPOSCOPY  2004   [facility]  . KNEE ARTHROSCOPY     RIGHT  . TONSILLECTOMY AND ADENOIDECTOMY    . TUBAL LIGATION      Family History  Problem Relation Age of Onset  . Hypertension Mother   . Cancer Father 53       MELANOMA  . Cancer Maternal Grandmother 15       OVARY  . Breast cancer Paternal Grandmother 41       BRAIN    Social History   Socioeconomic History  . Marital status: Divorced    Spouse name: Not on file  . Number of children: Not on file  . Years of education: Not on file  . Highest education level: Not on file  Occupational History  . Not on file  Tobacco Use  . Smoking status: Never Smoker  . Smokeless tobacco: Never Used  Vaping Use  . Vaping Use: Never used  Substance and Sexual Activity  . Alcohol use: Yes    Comment: OCC  . Drug use: No  .  Sexual activity: Yes    Partners: Male    Birth control/protection: Surgical    Comment: Hysterectomy  Other Topics Concern  . Not on file  Social History Narrative  . Not on file   Social Determinants of Health   Financial Resource Strain: Not on file  Food Insecurity: Not on file  Transportation Needs: Not on file  Physical Activity: Not on file  Stress: Not on file  Social Connections: Not on file  Intimate Partner Violence: Not on file    Outpatient Medications Prior to Visit  Medication Sig Dispense Refill  . ALPRAZolam (XANAX) 0.25 MG tablet Take 1 tablet (0.25 mg total) by mouth 2 (two) times daily as needed for anxiety. 30 tablet 0  . Butalbital-APAP-Caffeine 50-325-40 MG capsule Take 1-2 capsules by mouth every 6 (six) hours as needed for headache. 20 capsule 0  . ibuprofen (ADVIL) 600 MG tablet Take 1 tablet (600 mg total) by mouth every 6 (six) hours as needed. 30 tablet 0  . PARoxetine (PAXIL) 20 MG tablet TAKE 1 TABLET  BY MOUTH EVERY DAY 30 tablet 0   No facility-administered medications prior to visit.     ROS:  Review of Systems  Constitutional: Negative for fatigue, fever and unexpected weight change.  Respiratory: Negative for cough, shortness of breath and wheezing.   Cardiovascular: Negative for chest pain, palpitations and leg swelling.  Gastrointestinal: Negative for blood in stool, constipation, diarrhea, nausea and vomiting.  Endocrine: Negative for cold intolerance, heat intolerance and polyuria.  Genitourinary: Negative for dyspareunia, dysuria, flank pain, frequency, genital sores, hematuria, menstrual problem, pelvic pain, urgency, vaginal bleeding, vaginal discharge and vaginal pain.  Musculoskeletal: Negative for back pain, joint swelling and myalgias.  Skin: Negative for rash.  Neurological: Positive for headaches. Negative for dizziness, syncope, light-headedness and numbness.  Hematological: Negative for adenopathy.  Psychiatric/Behavioral: Negative for agitation, confusion, dysphoric mood, sleep disturbance and suicidal ideas. The patient is not nervous/anxious.    BREAST: No symptoms   Objective: There were no vitals taken for this visit.   Physical Exam Constitutional:      Appearance: She is well-developed.  Genitourinary:     Vulva normal.     Genitourinary Comments: UTERUS/CX SURG REM     No vaginal discharge, erythema or tenderness.      Right Adnexa: absent.    Right Adnexa: not tender and no mass present.    Left Adnexa: not tender and no mass present.    Cervix is absent.     Uterus is absent.  Breasts:     Right: No mass, nipple discharge, skin change or tenderness.     Left: Mass, skin change and tenderness present. No nipple discharge.    Neck:     Thyroid: No thyromegaly.  Cardiovascular:     Rate and Rhythm: Normal rate and regular rhythm.     Heart sounds: Normal heart sounds. No murmur heard.   Pulmonary:     Effort: Pulmonary effort is  normal.     Breath sounds: Normal breath sounds.  Chest:    Abdominal:     Palpations: Abdomen is soft.     Tenderness: There is no abdominal tenderness. There is no guarding.  Musculoskeletal:        General: Normal range of motion.     Cervical back: Normal range of motion.  Neurological:     General: No focal deficit present.     Mental Status: She is alert and oriented to person, place,  and time.     Cranial Nerves: No cranial nerve deficit.  Skin:    General: Skin is warm and dry.  Psychiatric:        Mood and Affect: Mood normal.        Behavior: Behavior normal.        Thought Content: Thought content normal.        Judgment: Judgment normal.  Vitals reviewed.     Assessment/Plan: Encounter for annual routine gynecological examination  Cervical cancer screening - Plan: Cytology - PAP  Screening for HPV (human papillomavirus) - Plan: Cytology - PAP  Screening for breast cancer--Pt to sched mammo  Anxiety and depression - Plan: PARoxetine (PAXIL) 20 MG tablet; Rx RF paxil. F/u prn.   Cutaneous abscess of chest wall - Plan: doxycycline (VIBRAMYCIN) 100 MG capsule; Small area, better to drain than I&D. Warm compresses/try abx since sx recurred. F/u prn.   Needs flu shot - Plan: Flu Vaccine QUAD 36+ mos IM (Fluarix, Quad PF)  No orders of the defined types were placed in this encounter.            GYN counsel breast self exam, mammography screening, menopause, adequate intake of calcium and vitamin D, diet and exercise     F/U  No follow-ups on file.  Paralee Pendergrass B. Fabian Walder, PA-C 09/27/2020 2:42 PM

## 2020-09-28 ENCOUNTER — Ambulatory Visit: Payer: Medicaid Other | Admitting: Obstetrics and Gynecology

## 2020-09-28 ENCOUNTER — Telehealth: Payer: Self-pay

## 2020-09-28 DIAGNOSIS — Z1151 Encounter for screening for human papillomavirus (HPV): Secondary | ICD-10-CM

## 2020-09-28 DIAGNOSIS — Z124 Encounter for screening for malignant neoplasm of cervix: Secondary | ICD-10-CM

## 2020-09-28 DIAGNOSIS — F419 Anxiety disorder, unspecified: Secondary | ICD-10-CM

## 2020-09-28 DIAGNOSIS — Z1231 Encounter for screening mammogram for malignant neoplasm of breast: Secondary | ICD-10-CM

## 2020-09-28 DIAGNOSIS — R87612 Low grade squamous intraepithelial lesion on cytologic smear of cervix (LGSIL): Secondary | ICD-10-CM

## 2020-09-28 DIAGNOSIS — Z01419 Encounter for gynecological examination (general) (routine) without abnormal findings: Secondary | ICD-10-CM

## 2020-09-28 NOTE — Telephone Encounter (Signed)
Patient informed of pathology results 

## 2020-09-28 NOTE — Telephone Encounter (Signed)
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

## 2020-10-17 ENCOUNTER — Ambulatory Visit: Payer: Medicaid Other | Admitting: Dermatology

## 2020-10-17 ENCOUNTER — Other Ambulatory Visit: Payer: Self-pay

## 2020-10-17 DIAGNOSIS — L720 Epidermal cyst: Secondary | ICD-10-CM

## 2020-10-17 MED ORDER — MUPIROCIN 2 % EX OINT: 1.0000 "application " | TOPICAL_OINTMENT | Freq: Every day | CUTANEOUS | 0 refills | Status: DC

## 2020-10-17 NOTE — Patient Instructions (Signed)

## 2020-10-17 NOTE — Progress Notes (Signed)
   Follow-Up Visit   Subjective  Kim Schmidt is a 45 y.o. female who presents for the following: Cyst (Right back post waistline - Excise today).  The following portions of the chart were reviewed this encounter and updated as appropriate:   Tobacco  Allergies  Meds  Problems  Med Hx  Surg Hx  Fam Hx     Review of Systems:  No other skin or systemic complaints except as noted in HPI or Assessment and Plan.  Objective  Well appearing patient in no apparent distress; mood and affect are within normal limits.  A focused examination was performed including back. Relevant physical exam findings are noted in the Assessment and Plan.  Objective  Right Lower Back post waistline: 1.5 cm subcutaneous nodule.    Assessment & Plan  Epidermal inclusion cyst Right Lower Back post waistline  Skin excision - Right Lower Back post waistline  Lesion length (cm):  1.5 Lesion width (cm):  1.5 Margin per side (cm):  0 Total excision diameter (cm):  1.5 Informed consent: discussed and consent obtained   Timeout: patient name, date of birth, surgical site, and procedure verified   Procedure prep:  Patient was prepped and draped in usual sterile fashion Prep type:  Isopropyl alcohol and povidone-iodine Anesthesia: the lesion was anesthetized in a standard fashion   Anesthetic:  1% lidocaine w/ epinephrine 1-100,000 buffered w/ 8.4% NaHCO3 Instrument used: #15 blade   Hemostasis achieved with: pressure   Hemostasis achieved with comment:  Electrocautery Outcome: patient tolerated procedure well with no complications   Post-procedure details: sterile dressing applied and wound care instructions given   Dressing type: bandage and pressure dressing (mupirocin)    Skin repair - Right Lower Back post waistline Complexity:  Complex Final length (cm):  2.7 Reason for type of repair: reduce tension to allow closure, reduce the risk of dehiscence, infection, and necrosis, reduce  subcutaneous dead space and avoid a hematoma, allow closure of the large defect, preserve normal anatomy, preserve normal anatomical and functional relationships and enhance both functionality and cosmetic results   Undermining: area extensively undermined   Undermining comment:  Undermining defect 1.5 cm Subcutaneous layers (deep stitches):  Suture size:  4-0 Suture type: Vicryl (polyglactin 910)   Subcutaneous suture technique: inverted dermal. Fine/surface layer approximation (top stitches):  Suture size:  4-0 Suture type: nylon   Stitches: simple running   Suture removal (days):  7 Hemostasis achieved with: suture and pressure Outcome: patient tolerated procedure well with no complications   Post-procedure details: sterile dressing applied and wound care instructions given   Dressing type: bandage and pressure dressing (mupirocin)    mupirocin ointment (BACTROBAN) 2 % - Right Lower Back post waistline  Specimen 1 - Surgical pathology Differential Diagnosis: Cyst vs other Check Margins: No Subcutaneous nodule.  Return in about 1 week (around 10/24/2020) for suture removal.   I, Ashok Cordia, CMA, am acting as scribe for Sarina Ser, MD .  Documentation: I have reviewed the above documentation for accuracy and completeness, and I agree with the above.  Sarina Ser, MD

## 2020-10-24 ENCOUNTER — Telehealth: Payer: Self-pay

## 2020-10-24 ENCOUNTER — Ambulatory Visit: Payer: Medicaid Other | Admitting: Dermatology

## 2020-10-24 NOTE — Telephone Encounter (Signed)
Left message on voicemail to return my call. She no showed her suture removal appointment today at 3:30pm.

## 2020-10-25 ENCOUNTER — Telehealth: Payer: Self-pay

## 2020-10-25 NOTE — Telephone Encounter (Signed)
Left message on voicemail to return my call. She also needs to come into the office to have her sutures removed.

## 2020-10-25 NOTE — Telephone Encounter (Signed)
-----   Message from Ralene Bathe, MD sent at 10/23/2020  9:56 AM EDT ----- Diagnosis Skin (M), right lower back post waistline EPIDERMOID CYST, INFLAMED AND DISRUPTED  Benign cyst

## 2020-10-26 ENCOUNTER — Telehealth: Payer: Self-pay

## 2020-10-26 NOTE — Telephone Encounter (Signed)
Left message on voicemail to return my call. Patient still needs to be scheduled to have her stitches removed.

## 2020-10-26 NOTE — Telephone Encounter (Signed)
-----   Message from Ralene Bathe, MD sent at 10/23/2020  9:56 AM EDT ----- Diagnosis Skin (M), right lower back post waistline EPIDERMOID CYST, INFLAMED AND DISRUPTED  Benign cyst

## 2020-10-27 ENCOUNTER — Encounter: Payer: Self-pay | Admitting: Dermatology

## 2020-10-31 ENCOUNTER — Telehealth: Payer: Self-pay

## 2020-10-31 NOTE — Telephone Encounter (Signed)
Left message for patient to call office for results and for a status on her sutures as she missed her suture removal appointment.

## 2020-10-31 NOTE — Telephone Encounter (Signed)
-----   Message from Ralene Bathe, MD sent at 10/23/2020  9:56 AM EDT ----- Diagnosis Skin (M), right lower back post waistline EPIDERMOID CYST, INFLAMED AND DISRUPTED  Benign cyst

## 2020-11-03 ENCOUNTER — Ambulatory Visit: Payer: Medicaid Other | Admitting: Obstetrics

## 2021-01-02 ENCOUNTER — Other Ambulatory Visit: Payer: Self-pay

## 2021-01-02 ENCOUNTER — Ambulatory Visit: Payer: Medicaid Other | Admitting: Dermatology

## 2021-01-02 DIAGNOSIS — L308 Other specified dermatitis: Secondary | ICD-10-CM

## 2021-01-02 MED ORDER — DUPIXENT 300 MG/2ML ~~LOC~~ SOAJ: 600.0000 mg | Freq: Once | SUBCUTANEOUS | 0 refills | Status: AC

## 2021-01-02 MED ORDER — DUPIXENT 300 MG/2ML ~~LOC~~ SOAJ: 300.0000 mg | SUBCUTANEOUS | 5 refills | Status: DC

## 2021-01-02 MED ORDER — TACROLIMUS 0.1 % EX OINT: TOPICAL_OINTMENT | Freq: Two times a day (BID) | CUTANEOUS | 0 refills | Status: DC

## 2021-01-02 NOTE — Progress Notes (Signed)
   Follow-Up Visit   Subjective  Kim Schmidt is a 45 y.o. female who presents for the following: Eczema (Patient here today for eczema at hands, legs, arms. Patient using clobetasol ointment at bedtime to hands and cover with gloves as needed and TMC during the day, both prescribed by Dr. Phillip Heal. Hands are worst area for patient and she does not get improvement with medications. ).  Patient advises eczema started in April 2020 after receiving Covid vaccine.   The following portions of the chart were reviewed this encounter and updated as appropriate:   Tobacco  Allergies  Meds  Problems  Med Hx  Surg Hx  Fam Hx      Review of Systems:  No other skin or systemic complaints except as noted in HPI or Assessment and Plan.  Objective  Well appearing patient in no apparent distress; mood and affect are within normal limits.  A focused examination was performed including arms, legs, hands. Relevant physical exam findings are noted in the Assessment and Plan.  Arms, legs, hands Scaly pink plaques palms, antecubital fossa, lower legs   Assessment & Plan  Other eczema Arms, legs, hands  Chronic condition with duration over one year. Condition is bothersome to patient. Currently flared. Patient has tried and failed TMC and clobetasol.  Start tacrolimus twice daily every day. Recommend Gentle Skin Care. Attached to AVS.   Discussed treatment with Dupixent. No hx of cold sores.   Dupilumab (Dupixent) is a treatment given by injection for adults with moderate-to-severe atopic dermatitis. Goal is control of skin condition, not cure. It is given as 2 injections at the first dose followed by 1 injection ever 2 weeks thereafter.  Potential side effects include allergic reaction, herpes infections, injection site reactions and conjunctivitis (inflammation of the eyes).  The use of Dupixent requires long term medication management, including periodic office visits.   tacrolimus  (PROTOPIC) 0.1 % ointment - Arms, legs, hands Apply topically 2 (two) times daily.  Related Medications Dupilumab (DUPIXENT) 300 MG/2ML SOPN Inject 300 mg into the skin every 14 (fourteen) days. Starting at day 15 for maintenance.  Dupilumab (DUPIXENT) 300 MG/2ML SOPN Inject 600 mg into the skin once for 1 dose. On day 1.  Return for 8 weeks eczema follow-up; sooner for dupixent injection if approved.  Graciella Belton, RMA, am acting as scribe for Forest Gleason, MD .  Documentation: I have reviewed the above documentation for accuracy and completeness, and I agree with the above.  Forest Gleason, MD

## 2021-01-02 NOTE — Patient Instructions (Signed)
Gentle Skin Care Guide  1. Bathe no more than once a day.  2. Avoid bathing in hot water  3. Use a mild soap like Dove, Vanicream, Cetaphil, CeraVe. Can use Lever 2000 or Cetaphil antibacterial soap  4. Use soap only where you need it. On most days, use it under your arms, between your legs, and on your feet. Let the water rinse other areas unless visibly dirty.  5. When you get out of the bath/shower, use a towel to gently blot your skin dry, don't rub it.  6. While your skin is still a little damp, apply a moisturizing cream such as Vanicream, CeraVe, Cetaphil, Eucerin, Sarna lotion or plain Vaseline Jelly. For hands apply Neutrogena [place] Hand Cream or Excipial Hand Cream.  7. Reapply moisturizer any time you start to itch or feel dry.  8. Sometimes using free and clear laundry detergents can be helpful. Fabric softener sheets should be avoided. Downy Free & Gentle liquid, or any liquid fabric softener that is free of dyes and perfumes, it acceptable to use  9. If your doctor has given you prescription creams you may apply moisturizers over them   Dupilumab (Dupixent) is a treatment given by injection for adults with moderate-to-severe atopic dermatitis. Goal is control of skin condition, not cure. It is given as 2 injections at the first dose followed by 1 injection ever 2 weeks thereafter.  Potential side effects include allergic reaction, herpes infections, injection site reactions and conjunctivitis (inflammation of the eyes).  The use of Dupixent requires long term medication management, including periodic office visits.  If you have any questions or concerns for your doctor, please call our main line at [phone] and press option 4 to reach your doctor's medical assistant. If no one answers, please leave a voicemail as directed and we will return your call as soon as possible. Messages left after 4 pm will be answered the following business day.   You may also send [place] a  message via [place]. We typically respond to MyChart messages within 1-2 business days.  For prescription refills, please ask your pharmacy to contact our office. Our fax number is [phone].  If you have an urgent issue when the clinic is closed that cannot wait until the next business day, you can page your doctor at the number below.    Please note that while we do our best to be available for urgent issues outside of office hours, we are not available 24/7.   If you have an urgent issue and are unable to reach [place], you may choose to seek medical care at your doctor's office, retail clinic, urgent care center, or emergency room.  If you have a medical emergency, please immediately call 911 or go to the emergency department.  Pager Numbers  - Dr. Nehemiah Massed: [phone]  - Dr. Laurence Ferrari: [phone]  - Dr. Nicole Kindred: [phone]  In the event of inclement weather, please call our main line at [phone] for an update on the status of any delays or closures.  Dermatology Medication Tips: Please keep the boxes that topical medications come in in order to help keep track of the instructions about where and how to use these. Pharmacies typically print the medication instructions only on the boxes and not directly on the medication tubes.   If your medication is too expensive, please contact our office at [phone] option 4 or send [place] a message through [place].   We are unable to tell what your co-pay for medications  will be in advance as this is different depending on your insurance coverage. However, we may be able to find a substitute medication at lower cost or fill out paperwork to get insurance to cover a needed medication.   If a prior authorization is required to get your medication covered by your insurance company, please allow [place] 1-2 business days to complete this process.  Drug prices often vary depending on where the prescription is filled and some pharmacies may offer  cheaper prices.  The website www.goodrx.com contains coupons for medications through different pharmacies. The prices here do not account for what the cost may be with help from insurance (it may be cheaper with your insurance), but the website can give you the price if you did not use any insurance.  - You can print the associated coupon and take it with your prescription to the pharmacy.  - You may also stop by our office during regular business hours and pick up a GoodRx coupon card.  - If you need your prescription sent electronically to a different pharmacy, notify our office through [facility] or by phone at [phone] option 4.

## 2021-01-08 ENCOUNTER — Ambulatory Visit: Payer: Medicaid Other | Admitting: Obstetrics and Gynecology

## 2021-01-10 ENCOUNTER — Encounter: Payer: Self-pay | Admitting: Dermatology

## 2021-01-17 ENCOUNTER — Other Ambulatory Visit: Payer: Self-pay

## 2021-01-17 ENCOUNTER — Ambulatory Visit (INDEPENDENT_AMBULATORY_CARE_PROVIDER_SITE_OTHER): Payer: Medicaid Other | Admitting: Dermatology

## 2021-01-17 ENCOUNTER — Encounter: Payer: Self-pay | Admitting: Dermatology

## 2021-01-17 DIAGNOSIS — L209 Atopic dermatitis, unspecified: Secondary | ICD-10-CM | POA: Diagnosis not present

## 2021-01-17 MED ORDER — DUPILUMAB 300 MG/2ML ~~LOC~~ SOAJ
300.0000 mg | Freq: Once | SUBCUTANEOUS | Status: AC
  Administered 2021-01-17: 300 mg via SUBCUTANEOUS

## 2021-01-17 MED ORDER — DUPILUMAB 300 MG/2ML ~~LOC~~ SOPN
300.0000 mg | PEN_INJECTOR | Freq: Once | SUBCUTANEOUS | Status: AC
Start: 2021-01-17 — End: 2021-01-17
  Administered 2021-01-17: 300 mg via SUBCUTANEOUS

## 2021-01-17 NOTE — Progress Notes (Signed)
   Follow-Up Visit   Subjective  Kim Schmidt is a 45 y.o. female who presents for the following: Atopic dermatitis. She notes some improvement with tacrolimus but is still not clearing. Here to start dupixent today.  Patient only brought in 1 pen so we needed to pull another pen from samples for loading dose.   The following portions of the chart were reviewed this encounter and updated as appropriate:  Tobacco  Allergies  Meds  Problems  Med Hx  Surg Hx  Fam Hx       Objective  Well appearing patient in no apparent distress; mood and affect are within normal limits.  A focused examination was performed including face, hands. Relevant physical exam findings are noted in the Assessment and Plan.  Right Upper Arm - Anterior Scaly pink plaques at hands (legs not viewed today)  Assessment & Plan  Atopic dermatitis, unspecified type Right Upper Arm - Anterior  Atopic dermatitis (eczema) is a chronic, relapsing, pruritic condition that can significantly affect quality of life. It is often associated with allergic rhinitis and/or asthma and can require treatment with topical medications, phototherapy, or in severe cases a biologic medication called Dupixent in children and adults.   Arms, legs, hands   Chronic condition with duration over one year. Condition is bothersome to patient. Currently flared. Patient has tried and failed TMC and clobetasol.   Continue tacrolimus twice daily every day to affected areas.   No hx of cold sores.    Dupilumab (Dupixent) is a treatment given by injection for adults with moderate-to-severe atopic dermatitis. Goal is control of skin condition, not cure. It is given as 2 injections at the first dose followed by 1 injection ever 2 weeks thereafter.   Potential side effects include allergic reaction, herpes infections, injection site reactions and conjunctivitis (inflammation of the eyes).  The use of Dupixent requires long term  medication management, including periodic office visits.     Start Dupixent today  Related Medications Dupilumab (DUPIXENT) 300 MG/2ML SOPN Inject 300 mg into the skin every 14 (fourteen) days. Starting at day 15 for maintenance.  Injected patient in office today with first initial dose of Dupilumab 300 mg /2 ml sopn 1 pen in each arm to equal 600 mg total. Patient tolerated well.     Related Medications Dupilumab SOPN 300 mg   Dupilumab SOPN 300 mg   Return for keep follow up as schedule . I, Ruthell Rummage, CMA, am acting as scribe for Forest Gleason, MD.  Documentation: I have reviewed the above documentation for accuracy and completeness, and I agree with the above.  Forest Gleason, MD

## 2021-01-17 NOTE — Patient Instructions (Signed)
Dupilumab (Dupixent) is a treatment given by injection for adults with moderate-to-severe atopic dermatitis. Goal is control of skin condition, not cure. It is given as 2 injections at the first dose followed by 1 injection ever 2 weeks thereafter.  Potential side effects include allergic reaction, herpes infections, injection site reactions and conjunctivitis (inflammation of the eyes).  The use of Dupixent requires long term medication management, including periodic office visits.  If you have any questions or concerns for your doctor, please call our main line at [phone] and press option 4 to reach your doctor's medical assistant. If no one answers, please leave a voicemail as directed and we will return your call as soon as possible. Messages left after 4 pm will be answered the following business day.   You may also send [place] a message via [place]. We typically respond to MyChart messages within 1-2 business days.  For prescription refills, please ask your pharmacy to contact our office. Our fax number is [phone].  If you have an urgent issue when the clinic is closed that cannot wait until the next business day, you can page your doctor at the number below.    Please note that while we do our best to be available for urgent issues outside of office hours, we are not available 24/7.   If you have an urgent issue and are unable to reach [place], you may choose to seek medical care at your doctor's office, retail clinic, urgent care center, or emergency room.  If you have a medical emergency, please immediately call 911 or go to the emergency department.  Pager Numbers  - Dr. Nehemiah Massed: [phone]  - Dr. Laurence Ferrari: [phone]  - Dr. Nicole Kindred: [phone]  In the event of inclement weather, please call our main line at [phone] for an update on the status of any delays or closures.  Dermatology Medication Tips: Please keep the boxes that topical medications come in in order to  help keep track of the instructions about where and how to use these. Pharmacies typically print the medication instructions only on the boxes and not directly on the medication tubes.   If your medication is too expensive, please contact our office at [phone] option 4 or send [place] a message through [place].   We are unable to tell what your co-pay for medications will be in advance as this is different depending on your insurance coverage. However, we may be able to find a substitute medication at lower cost or fill out paperwork to get insurance to cover a needed medication.   If a prior authorization is required to get your medication covered by your insurance company, please allow [place] 1-2 business days to complete this process.  Drug prices often vary depending on where the prescription is filled and some pharmacies may offer cheaper prices.  The website www.goodrx.com contains coupons for medications through different pharmacies. The prices here do not account for what the cost may be with help from insurance (it may be cheaper with your insurance), but the website can give you the price if you did not use any insurance.  - You can print the associated coupon and take it with your prescription to the pharmacy.  - You may also stop by our office during regular business hours and pick up a GoodRx coupon card.  - If you need your prescription sent electronically to a different pharmacy, notify our office through [facility] or by phone at [phone] option 4.

## 2021-03-15 ENCOUNTER — Ambulatory Visit: Payer: Medicaid Other | Admitting: Dermatology

## 2021-03-24 IMAGING — CR DG KNEE COMPLETE 4+V*L*
4 series · 4 of 4 positions shown · non-contrast
Comparison: None.

CLINICAL DATA: Fell yesterday, hyperextension left knee, pain

EXAM:
LEFT KNEE - COMPLETE 4+ VIEW

[knee ap]
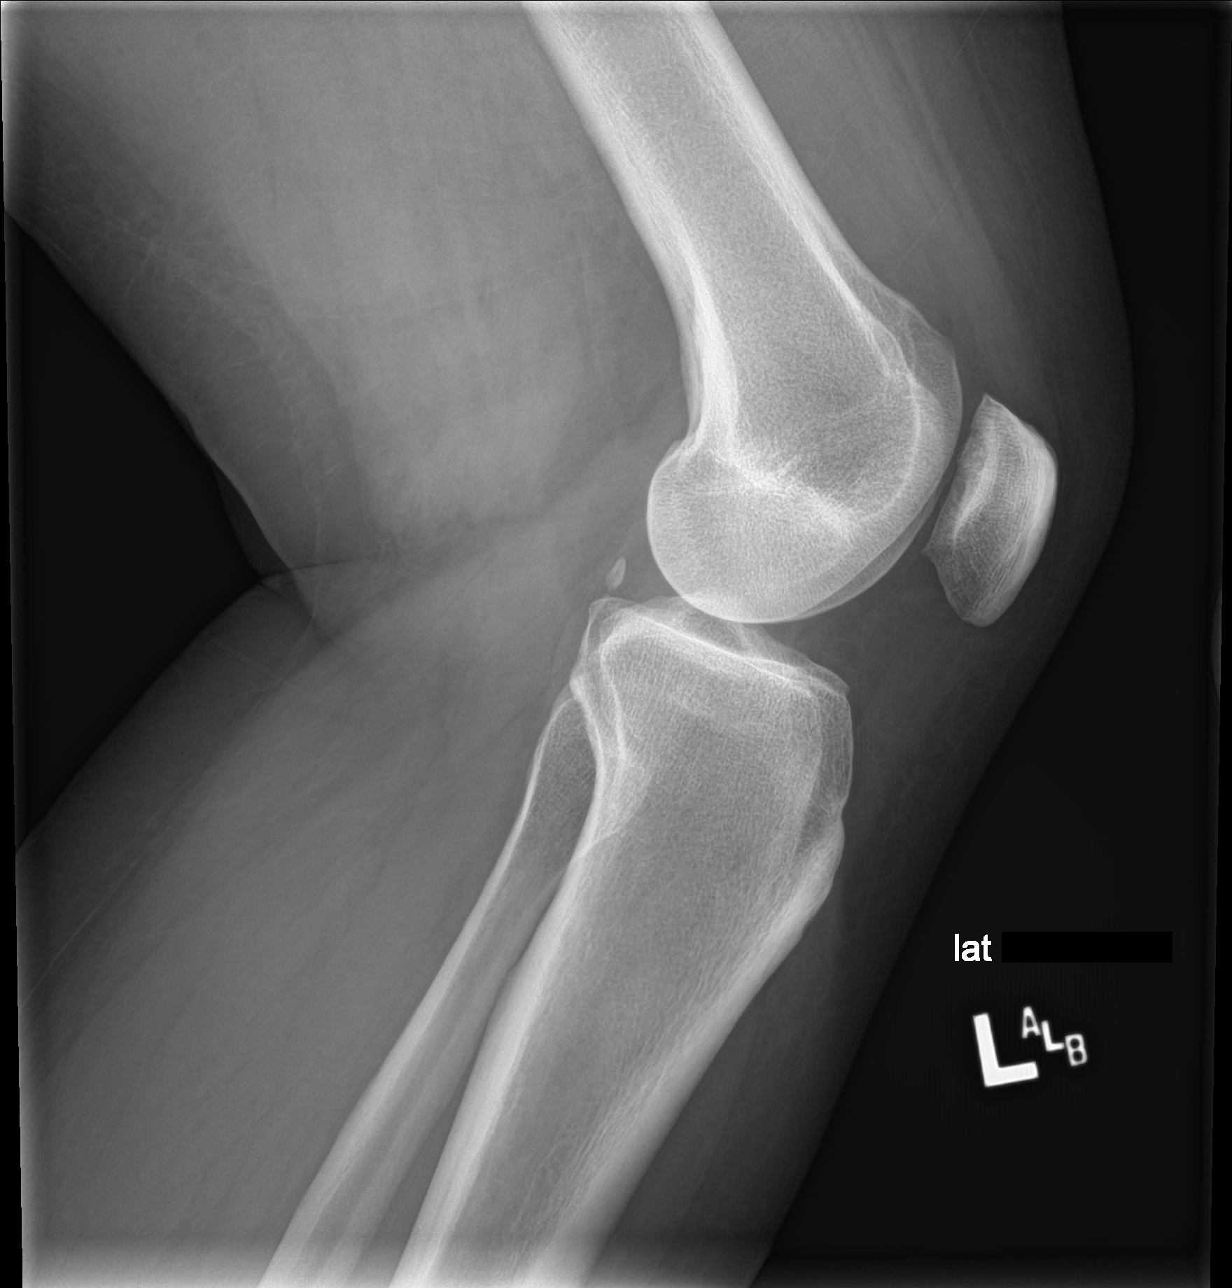

[knee lat]
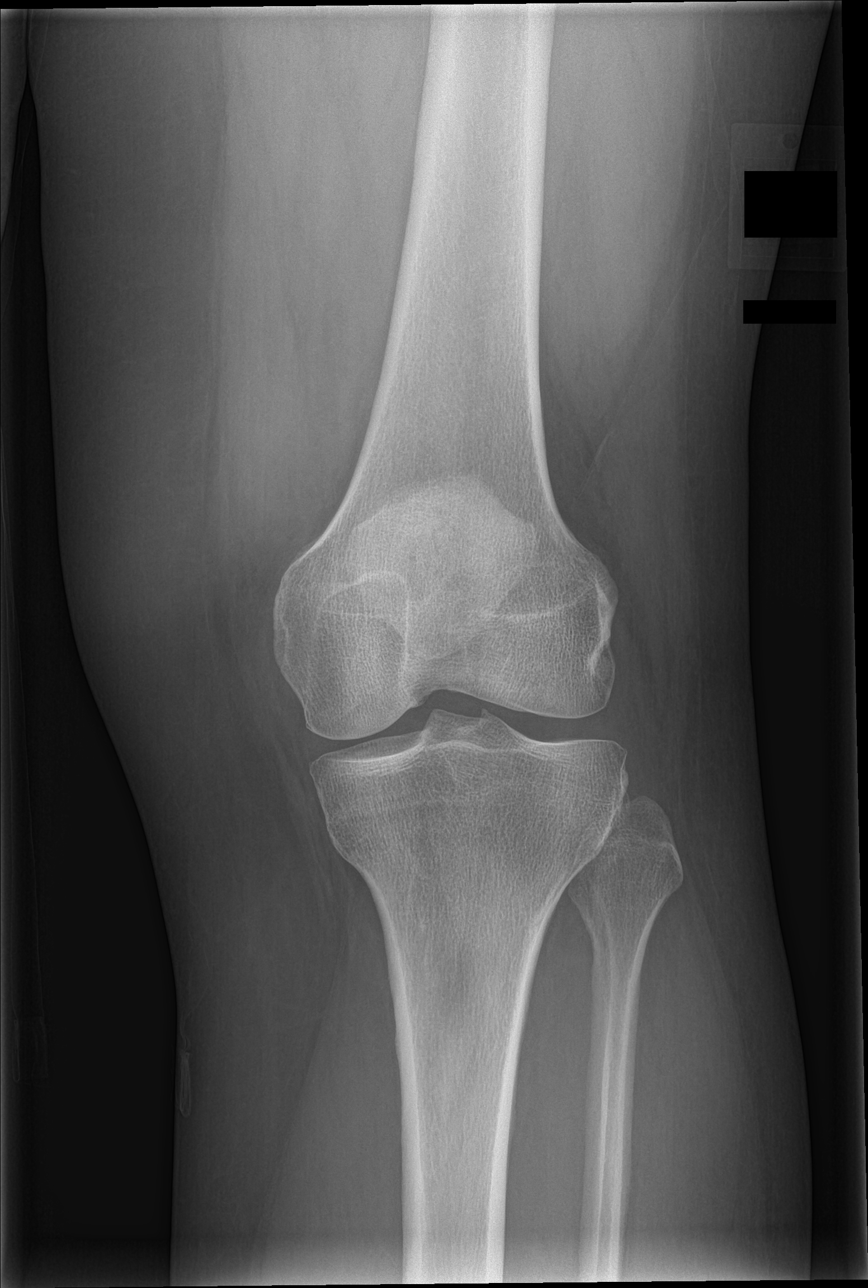

[tunnel]
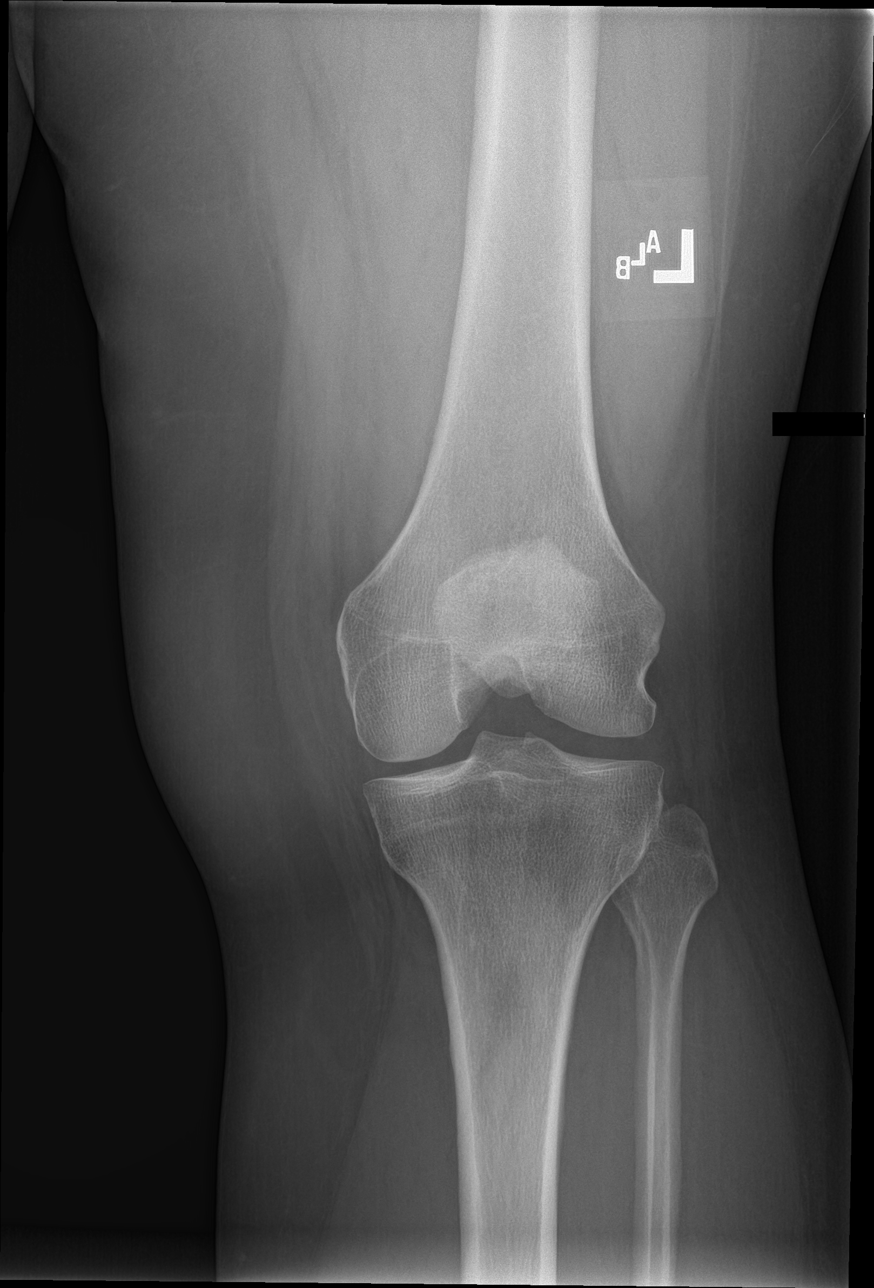

[patella skyline]
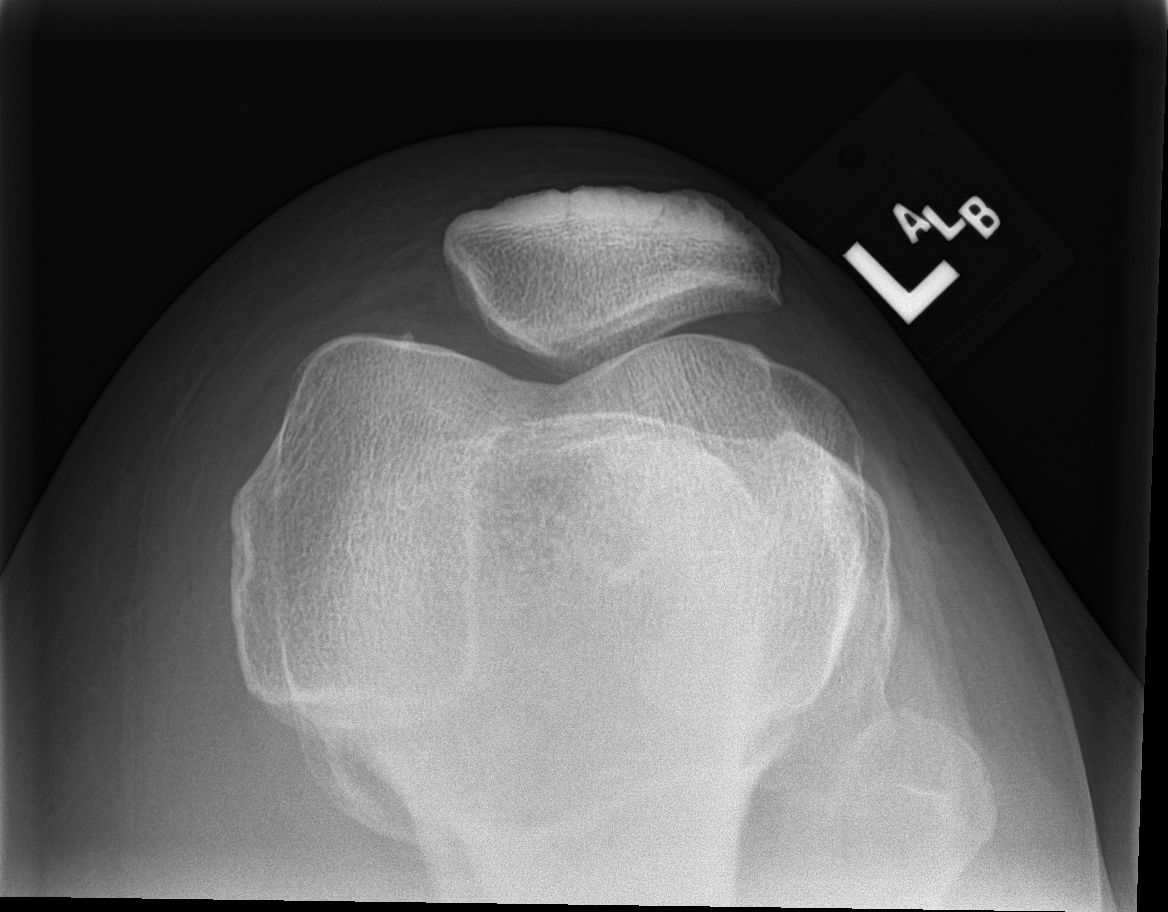

[4 of 4 positions shown; findings below may reference images not displayed]

FINDINGS: Frontal, oblique, lateral, and sunrise views of the left knee are
obtained. No fracture, subluxation, or dislocation. Joint spaces are
well preserved. Minimal patellar spurring. No joint effusion. Soft
tissues are unremarkable.
IMPRESSION: 1. Mild patellofemoral osteoarthritis.  No acute fracture.

## 2021-04-11 ENCOUNTER — Other Ambulatory Visit: Payer: Self-pay

## 2021-04-11 ENCOUNTER — Other Ambulatory Visit (HOSPITAL_COMMUNITY)
Admission: RE | Admit: 2021-04-11 | Discharge: 2021-04-11 | Disposition: A | Discharge status: Home / Self Care | Payer: Medicaid Other | Source: Ambulatory Visit | Attending: Obstetrics | Admitting: Obstetrics

## 2021-04-11 ENCOUNTER — Encounter: Payer: Self-pay | Admitting: Obstetrics

## 2021-04-11 ENCOUNTER — Ambulatory Visit (INDEPENDENT_AMBULATORY_CARE_PROVIDER_SITE_OTHER): Payer: Medicaid Other | Admitting: Obstetrics

## 2021-04-11 VITALS — BP 130/90 | Ht 64.0 in | Wt 246.0 lb

## 2021-04-11 DIAGNOSIS — R87612 Low grade squamous intraepithelial lesion on cytologic smear of cervix (LGSIL): Secondary | ICD-10-CM

## 2021-04-11 DIAGNOSIS — Z1231 Encounter for screening mammogram for malignant neoplasm of breast: Secondary | ICD-10-CM

## 2021-04-11 DIAGNOSIS — Z01419 Encounter for gynecological examination (general) (routine) without abnormal findings: Secondary | ICD-10-CM | POA: Diagnosis not present

## 2021-04-11 DIAGNOSIS — F32A Depression, unspecified: Secondary | ICD-10-CM

## 2021-04-11 DIAGNOSIS — F419 Anxiety disorder, unspecified: Secondary | ICD-10-CM

## 2021-04-11 DIAGNOSIS — Z124 Encounter for screening for malignant neoplasm of cervix: Secondary | ICD-10-CM | POA: Insufficient documentation

## 2021-04-11 DIAGNOSIS — Z6841 Body Mass Index (BMI) 40.0 and over, adult: Secondary | ICD-10-CM

## 2021-04-11 MED ORDER — PAROXETINE HCL 20 MG PO TABS: ORAL_TABLET | ORAL | 12 refills | Status: DC

## 2021-04-11 NOTE — Progress Notes (Signed)
Gynecology Annual Exam  PCP: Chad Cordial, PA-C  Chief Complaint:  Chief Complaint  Patient presents with   Gynecologic Exam    Started taking dupixent and started having pain in ankles leg and back    History of Present -  She  is overdue for a pap smear. Had her last Annual with [facility], but missed a number of appointments. She has requested medication refills without scheduling a physical. She present today to "catch up" on her need for a Gyn exam , mammogram and medication refill. She also desirous of a referral for weight loss.  LMP: No LMP recorded. Patient has had a hysterectomy.  The patient is sexually active. She currently uses status post hysterectomy for contraception. She denies dyspareunia.  The patient does perform self breast exams.  There is no notable family history of breast or ovarian cancer in her family.  The patient wears seatbelts: yes.   The patient has regular exercise: no.    The patient denies current symptoms of depression.    Review of Systems: Review of Systems  Constitutional: Negative.   HENT: Negative.    Eyes: Negative.   Respiratory: Negative.    Cardiovascular: Negative.   Gastrointestinal: Negative.   Genitourinary: Negative.   Musculoskeletal: Negative.   Skin: Negative.   Neurological: Negative.   Endo/Heme/Allergies: Negative.   Psychiatric/Behavioral:  The patient is nervous/anxious.        Hx of anxiety- takes daily Paxil   Past Medical History:  Patient Active Problem List   Diagnosis Date Noted   Pap smear abnormality of cervix with LGSIL 04/11/2021    2020.    BRCA negative 03/22/2019    Negative Myriad MyRisk testing 01/03/2016    Obesity (BMI 35.0-39.9 without comorbidity) 07/06/2018   Family history of ovarian cancer 09/03/2017   Anxiety and depression 09/03/2017   Galactorrhea 09/03/2017   Perimenopausal vasomotor symptoms 09/03/2017    Past Surgical History:  Past Surgical History:  Procedure  Laterality Date   ABDOMINAL HYSTERECTOMY  05/14/2013   AMS; RSO; LT salpingectomy   CESAREAN SECTION  11/12/2011   twins; PJR   COLPOSCOPY  2004   [facility]   KNEE ARTHROSCOPY     RIGHT   TONSILLECTOMY AND ADENOIDECTOMY     TUBAL LIGATION      Gynecologic History:  No LMP recorded. Patient has had a hysterectomy. 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,mild dysplasia,CIN I)  Last mammogram: no record of mammogram   Obstetric History: [id number]  Family History:  Family History  Problem Relation Age of Onset   Hypertension Mother    Cancer Father 85       MELANOMA   Cancer Maternal Grandmother 40       OVARY   Breast cancer Paternal Grandmother 43       BRAIN    Social History:  Social History   Socioeconomic History   Marital status: Divorced    Spouse name: Not on file   Number of children: Not on file   Years of education: Not on file   Highest education level: Not on file  Occupational History   Not on file  Tobacco Use   Smoking status: Never   Smokeless tobacco: Never  Vaping Use   Vaping Use: Never used  Substance and Sexual Activity   Alcohol use: Yes    Comment: OCC   Drug use: No  Sexual activity: Yes    Partners: Male    Birth control/protection: Surgical    Comment: Hysterectomy  Other Topics Concern   Not on file  Social History Narrative   Not on file   Social Determinants of Health   Financial Resource Strain: Not on file  Food Insecurity: Not on file  Transportation Needs: Not on file  Physical Activity: Not on file  Stress: Not on file  Social Connections: Not on file  Intimate Partner Violence: Not on file    Allergies:  Allergies  Allergen Reactions   Sulfa Antibiotics Swelling    Medications: Prior to Admission medications   Medication Sig Start Date End Date Taking? Authorizing Provider  ALPRAZolam (XANAX) 0.25 MG  tablet Take 1 tablet (0.25 mg total) by mouth 2 (two) times daily as needed for anxiety. 7/61/60  Yes Copland, Ginette Otto  Butalbital-APAP-Caffeine 73-710-62 MG capsule Take 1-2 capsules by mouth every 6 (six) hours as needed for headache. 6/94/85  Yes Copland, Alicia B, PA-C  Dupilumab (DUPIXENT) 300 MG/2ML SOPN Inject 300 mg into the skin every 14 (fourteen) days. Starting at day 15 for maintenance. 01/02/21  Yes Moye, [place], MD  tacrolimus (PROTOPIC) 0.1 % ointment Apply topically 2 (two) times daily. 01/02/21  Yes Moye, [place], MD  PARoxetine (PAXIL) 20 MG tablet TAKE 1 TABLET BY MOUTH EVERY DAY 04/11/21   Imagene Riches, CNM    Physical Exam Vitals: Blood pressure 130/90, height _0  (1.626 m), weight 246 lb (111.6 kg). BMI is 42.23  General: NAD HEENT: normocephalic, anicteric Thyroid: no enlargement, no palpable nodules Pulmonary: No increased work of breathing, CTAB Cardiovascular: RRR, distal pulses 2+ Breast: Breast symmetrical, pendulous, no tenderness, no palpable nodules or masses, no skin or nipple retraction present, no nipple discharge.  Fibrocystic changes noted.No axillary or supraclavicular lymphadenopathy. Abdomen: NABS, soft, non-tender, non-distended.  Umbilicus without lesions.  No hepatomegaly, splenomegaly or masses palpable. No evidence of hernia  Genitourinary:  External: Normal external female genitalia.  Normal urethral meatus, normal Bartholin's and Skene's glands.    Vagina: Normal vaginal mucosa, no evidence of prolapse.    Cervix: Grossly normal in appearance, no bleeding- cervical "cuff"   Adnexa: ovaries non-enlarged, no adnexal masses  Rectal: deferred  Lymphatic: no evidence of inguinal lymphadenopathy Extremities: no edema, erythema, or tenderness Neurologic: Grossly intact Psychiatric: mood appropriate, affect full  Female chaperone present for pelvic and breast  portions of the physical exam    Assessment: 45 y.o. [id number] routine annual  exam Needs baseline mammogram Last pap smear was LGSIL- for pap smear today. Morbid obesity  Plan: Problem List Items Addressed This Visit       Other   Anxiety and depression   Relevant Medications   PARoxetine (PAXIL) 20 MG tablet   Pap smear abnormality of cervix with LGSIL   Other Visit Diagnoses     Cervical cancer screening    -  Primary   Relevant Orders   Cytology - PAP   Women's annual routine gynecological examination       Breast cancer screening by mammogram       Relevant Orders   MM DIGITAL SCREENING BILATERAL   Morbid obesity with BMI of 40.0-44.9, adult ([place])       Relevant Orders   Amb Referral to Bariatric Surgery       1) Mammogram - recommend yearly screening mammogram.  Mammogram Was ordered today   2) STI screening  wasoffered and declined  3) ASCCP  guidelines and rational discussed.  Patient opts for yearly screening interval  4) Contraception - the patient is currently using  status post hysterectomy.  She is happy with her current form of contraception and plans to continue  5) Colonoscopy -- Screening recommended starting at age 58 for average risk individuals, age 28 for individuals deemed at increased risk (including African Americans) and recommended to continue until age 26.  For patient age 78-85 individualized approach is recommended.  Gold standard screening is via colonoscopy, Cologuard screening is an acceptable alternative for patient unwilling or unable to undergo colonoscopy.  "Colorectal cancer screening for average?risk adults: 2018 guideline update from the [place]: A Cancer Journal for Clinicians: Oct 30, 2016   6) Routine healthcare maintenance including cholesterol, diabetes screening discussed managed by PCP  7) Return in about 1 year (around 04/11/2022) for annual. We discussed her high BMI, and she is requesting a referral to a GI specialist for medical management fo weight loss. She is a candidate for  surgical weight loss. We will make a referral to a GI group that will take her insurance. Discussed the options that may be considered including surgical wt loss.  Imagene Riches, CNM  04/11/2021 6:12 PM   [facility] 04/11/2021, 6:03 PM

## 2021-04-16 ENCOUNTER — Other Ambulatory Visit: Payer: Medicaid Other

## 2021-04-16 ENCOUNTER — Other Ambulatory Visit: Payer: Self-pay | Admitting: Obstetrics

## 2021-04-16 ENCOUNTER — Other Ambulatory Visit: Payer: Self-pay

## 2021-04-16 DIAGNOSIS — Z0000 Encounter for general adult medical examination without abnormal findings: Secondary | ICD-10-CM

## 2021-04-17 ENCOUNTER — Encounter: Payer: Self-pay | Admitting: Obstetrics

## 2021-04-17 ENCOUNTER — Other Ambulatory Visit: Payer: Self-pay | Admitting: Obstetrics

## 2021-04-17 DIAGNOSIS — Z1231 Encounter for screening mammogram for malignant neoplasm of breast: Secondary | ICD-10-CM

## 2021-04-17 LAB — CBC WITH DIFFERENTIAL
Basophils Absolute: 0 10*3/uL (ref 0.0–0.2)
Basos: 0 %
EOS (ABSOLUTE): 0.3 10*3/uL (ref 0.0–0.4)
Eos: 3 %
Hematocrit: 40.5 % (ref 34.0–46.6)
Hemoglobin: 13.6 g/dL (ref 11.1–15.9)
Immature Grans (Abs): 0 10*3/uL (ref 0.0–0.1)
Immature Granulocytes: 0 %
Lymphocytes Absolute: 2.7 10*3/uL (ref 0.7–3.1)
Lymphs: 36 %
MCH: 30.7 pg (ref 26.6–33.0)
MCHC: 33.6 g/dL (ref 31.5–35.7)
MCV: 91 fL (ref 79–97)
Monocytes Absolute: 0.5 10*3/uL (ref 0.1–0.9)
Monocytes: 6 %
Neutrophils Absolute: 4 10*3/uL (ref 1.4–7.0)
Neutrophils: 55 %
RBC: 4.43 x10E6/uL (ref 3.77–5.28)
RDW: 13.2 % (ref 11.7–15.4)
WBC: 7.5 10*3/uL (ref 3.4–10.8)

## 2021-04-17 LAB — CYTOLOGY - PAP
Comment: NEGATIVE
Comment: NEGATIVE
Diagnosis: NEGATIVE
HPV 16: NEGATIVE
HPV 18 / 45: POSITIVE — AB
High risk HPV: POSITIVE — AB

## 2021-04-17 LAB — LIPID PANEL WITH LDL/HDL RATIO
Cholesterol, Total: 197 mg/dL (ref 100–199)
HDL: 38 mg/dL — ABNORMAL LOW (ref >39)
LDL Chol Calc (NIH): 129 mg/dL — ABNORMAL HIGH (ref 0–99)
LDL/HDL Ratio: 3.4 ratio — ABNORMAL HIGH (ref 0.0–3.2)
Triglycerides: 168 mg/dL — ABNORMAL HIGH (ref 0–149)
VLDL Cholesterol Cal: 30 mg/dL (ref 5–40)

## 2021-04-17 LAB — COMPREHENSIVE METABOLIC PANEL
ALT: 16 IU/L (ref 0–32)
AST: 14 IU/L (ref 0–40)
Albumin/Globulin Ratio: 1.5 (ref 1.2–2.2)
Albumin: 4 g/dL (ref 3.8–4.8)
Alkaline Phosphatase: 70 IU/L (ref 44–121)
BUN/Creatinine Ratio: 11 (ref 9–23)
BUN: 9 mg/dL (ref 6–24)
Bilirubin Total: 0.4 mg/dL (ref 0.0–1.2)
CO2: 20 mmol/L (ref 20–29)
Calcium: 8.8 mg/dL (ref 8.7–10.2)
Chloride: 104 mmol/L (ref 96–106)
Creatinine, Ser: 0.82 mg/dL (ref 0.57–1.00)
Globulin, Total: 2.7 g/dL (ref 1.5–4.5)
Glucose: 100 mg/dL — ABNORMAL HIGH (ref 70–99)
Potassium: 4.7 mmol/L (ref 3.5–5.2)
Sodium: 139 mmol/L (ref 134–144)
Total Protein: 6.7 g/dL (ref 6.0–8.5)
eGFR: 90 mL/min/{1.73_m2} (ref >59)

## 2021-04-17 LAB — VITAMIN D 25 HYDROXY (VIT D DEFICIENCY, FRACTURES): Vit D, 25-Hydroxy: 21.9 ng/mL — ABNORMAL LOW (ref 30.0–100.0)

## 2021-04-18 ENCOUNTER — Encounter: Payer: Self-pay | Admitting: Obstetrics

## 2021-05-09 ENCOUNTER — Other Ambulatory Visit: Payer: Self-pay | Admitting: Obstetrics

## 2021-05-09 DIAGNOSIS — F419 Anxiety disorder, unspecified: Secondary | ICD-10-CM

## 2021-05-09 DIAGNOSIS — F32A Depression, unspecified: Secondary | ICD-10-CM

## 2021-05-10 ENCOUNTER — Other Ambulatory Visit: Payer: Self-pay | Admitting: Obstetrics

## 2021-11-24 ENCOUNTER — Ambulatory Visit
Admission: EM | Admit: 2021-11-24 | Discharge: 2021-11-24 | Disposition: A | Discharge status: Home / Self Care | Payer: Medicaid Other | Attending: Physician Assistant | Admitting: Physician Assistant

## 2021-11-24 DIAGNOSIS — N3001 Acute cystitis with hematuria: Secondary | ICD-10-CM | POA: Insufficient documentation

## 2021-11-24 LAB — URINALYSIS, ROUTINE W REFLEX MICROSCOPIC
Glucose, UA: NEGATIVE mg/dL
Ketones, ur: NEGATIVE mg/dL
Nitrite: NEGATIVE
Protein, ur: >300 mg/dL — AB
Specific Gravity, Urine: 1.025 (ref 1.005–1.030)
pH: 6 (ref 5.0–8.0)

## 2021-11-24 LAB — URINALYSIS, MICROSCOPIC (REFLEX): RBC / HPF: >50 RBC/hpf (ref 0–5)

## 2021-11-24 MED ORDER — NITROFURANTOIN MONOHYD MACRO 100 MG PO CAPS: 100.0000 mg | ORAL_CAPSULE | Freq: Two times a day (BID) | ORAL | 0 refills | Status: DC

## 2021-11-24 NOTE — ED Provider Notes (Signed)
[facility]    CSN: [id number] Arrival date & time: 11/24/21  1115      History   Chief Complaint Chief Complaint  Patient presents with   Urinary Frequency    HPI Anah Hobday is a 46 y.o. female.   Pleasant 46 year old female presents today with concern of right flank pain starting on Wednesday evening.  She states it was mild in nature and thought nothing of it, Thursday evening started noticing slight blood in her urine, and states it is progressed to a darker blood.  She is not on a blood thinner.  She denies any injury to her abdomen or back.  She does have some discomfort with urination and pelvic pressure.  She also reports urinary frequency and hesitancy.  She denies any vaginal pain or discharge.  She denies a fever.  She has no history of kidney stones.  Apart from taking cranberry juice, she has not tried any additional treatments.   Urinary Frequency Pertinent negatives include no abdominal pain.    Past Medical History:  Diagnosis Date   Abdominal hernia    Anxiety    Constipation 08/15/2014   Depression    Dysplastic nevus 09/25/2020   L upper back paraspinal - moderate   Esophageal reflux 08/15/2014   Family history of ovarian cancer 04/21/2012   MGM; Mom is BRCA/BART neg.   Genetic testing of female 01/2016   MYRISK neg   Hiatal hernia 08/15/2014   History of Papanicolaou smear of cervix 04/21/2012   -/-   Hyperlipidemia 04/21/2012   LGSIL on Pap smear of cervix 02/2019   Migraine    menstrual    Patient Active Problem List   Diagnosis Date Noted   Pap smear abnormality of cervix with LGSIL 04/11/2021   BRCA negative 03/22/2019   Obesity (BMI 35.0-39.9 without comorbidity) 07/06/2018   Family history of ovarian cancer 09/03/2017   Anxiety and depression 09/03/2017   Galactorrhea 09/03/2017   Perimenopausal vasomotor symptoms 09/03/2017    Past Surgical History:  Procedure Laterality Date   ABDOMINAL HYSTERECTOMY   05/14/2013   AMS; RSO; LT salpingectomy   CESAREAN SECTION  11/12/2011   twins; PJR   COLPOSCOPY  2004   PJR   KNEE ARTHROSCOPY     RIGHT   TONSILLECTOMY AND ADENOIDECTOMY     TUBAL LIGATION      OB History     Gravida  3   Para  2   Term  2   Preterm      AB  1   Living  3      SAB  1   IAB      Ectopic      Multiple  1   Live Births  3            Home Medications    Prior to Admission medications   Medication Sig Start Date End Date Taking? Authorizing Provider  nitrofurantoin, macrocrystal-monohydrate, (MACROBID) 100 MG capsule Take 1 capsule (100 mg total) by mouth 2 (two) times daily. 11/24/21  Yes Frenchie Pribyl, Whitney L, PA  ALPRAZolam (XANAX) 0.25 MG tablet Take 1 tablet (0.25 mg total) by mouth 2 (two) times daily as needed for anxiety. 02/16/20   Copland, Ilona Sorrel, PA-C  butalbital-acetaminophen-caffeine (FIORICET) [phone] MG tablet     [provider]  Butalbital-APAP-Caffeine [phone] MG capsule Take 1-2 capsules by mouth every 6 (six) hours as needed for headache. 02/16/20   Copland, Ilona Sorrel, PA-C  Dupilumab (  DUPIXENT) 300 MG/2ML SOPN Inject 300 mg into the skin every 14 (fourteen) days. Starting at day 15 for maintenance. 01/02/21   Moye, [place], MD  PARoxetine (PAXIL) 20 MG tablet TAKE 1 TABLET BY MOUTH EVERY DAY 05/10/21   Mirna Mires, CNM  sertraline (ZOLOFT) 50 MG tablet Take 1 tablet every day by oral route.    [provider]  tacrolimus (PROTOPIC) 0.1 % ointment Apply topically 2 (two) times daily. 01/02/21   Sandi Mealy, MD    Family History Family History  Problem Relation Age of Onset   Hypertension Mother    Cancer Father 33       MELANOMA   Cancer Maternal Grandmother 6       OVARY   Breast cancer Paternal Grandmother 73       BRAIN    Social History Social History   Tobacco Use   Smoking status: Never   Smokeless tobacco: Never  Vaping Use   Vaping Use: Never used  Substance Use Topics    Alcohol use: Yes    Comment: OCC   Drug use: No     Allergies   Sulfa antibiotics   Review of Systems Review of Systems  Constitutional:  Negative for fatigue and fever.  Gastrointestinal:  Negative for abdominal pain.  Genitourinary:  Positive for dysuria, frequency, hematuria and urgency. Negative for vaginal discharge and vaginal pain.     Physical Exam Triage Vital Signs ED Triage Vitals  Enc Vitals Group     BP 11/24/21 1217 113/82     Pulse Rate 11/24/21 1217 84     Resp 11/24/21 1217 18     Temp 11/24/21 1217 98.5 F (36.9 C)     Temp Source 11/24/21 1217 Oral     SpO2 11/24/21 1217 97 %     Weight --      Height --      Head Circumference --      Peak Flow --      Pain Score 11/24/21 1220 4     Pain Loc --      Pain Edu? --      Excl. in GC? --    No data found.  Updated Vital Signs BP 113/82 (BP Location: Left Arm)   Pulse 84   Temp 98.5 F (36.9 C) (Oral)   Resp 18   SpO2 97%   Visual Acuity Right Eye Distance:   Left Eye Distance:   Bilateral Distance:    Right Eye Near:   Left Eye Near:    Bilateral Near:     Physical Exam Vitals and nursing note reviewed.  Constitutional:      General: She is not in acute distress.    Appearance: Normal appearance. She is normal weight. She is not ill-appearing, toxic-appearing or diaphoretic.  HENT:     Head: Normocephalic and atraumatic.  Eyes:     Pupils: Pupils are equal, round, and reactive to light.  Cardiovascular:     Rate and Rhythm: Normal rate.     Pulses: Normal pulses.  Pulmonary:     Effort: Pulmonary effort is normal. No respiratory distress.     Breath sounds: Normal breath sounds.  Abdominal:     General: Abdomen is flat. Bowel sounds are normal. There is no distension.     Palpations: Abdomen is soft. There is no mass.     Tenderness: There is no abdominal tenderness. There is no right CVA tenderness, left CVA tenderness, guarding or  rebound.     Hernia: No hernia is present.   Musculoskeletal:     Right lower leg: No edema.     Left lower leg: No edema.  Skin:    General: Skin is warm.     Findings: No bruising, erythema or rash.  Neurological:     General: No focal deficit present.     Mental Status: She is alert. Mental status is at baseline.  Psychiatric:        Mood and Affect: Mood normal.      UC Treatments / Results  Labs (all labs ordered are listed, but only abnormal results are displayed) Labs Reviewed  URINALYSIS, ROUTINE W REFLEX MICROSCOPIC - Abnormal; Notable for the following components:      Result Value   Color, Urine BROWN (*)    APPearance TURBID (*)    Hgb urine dipstick LARGE (*)    Bilirubin Urine SMALL (*)    Protein, ur >300 (*)    Leukocytes,Ua TRACE (*)    All other components within normal limits  URINALYSIS, MICROSCOPIC (REFLEX) - Abnormal; Notable for the following components:   Bacteria, UA FEW (*)    All other components within normal limits  URINE CULTURE    EKG   Radiology No results found.  Procedures Procedures (including critical care time)  Medications Ordered in UC Medications - No data to display  Initial Impression / Assessment and Plan / UC Course  I have reviewed the triage vital signs and the nursing notes.  Pertinent labs & imaging results that were available during my care of the patient were reviewed by me and considered in my medical decision making (see chart for details).     Acute cystitis with hematuria - bacteria noted in microscopic. Will send out culture. Pt is not on blood thinning medications, no acute trauma or injury to back. She does not have reproducible CVA tenderness, back pain is lower on R. No hx of kidney stones. Her sx sound consistent with simple cystitis with hematuria. Will start abx BID x 5 days, RTC/ ER precautions reviewed with pt  Final Clinical Impressions(s) / UC Diagnoses   Final diagnoses:  Acute cystitis with hematuria     Discharge Instructions       Your symptoms are consistent with a urinary tract infection. Start taking the antibiotic twice daily until completed. We will send out your urine culture and only call if a change in treatment is necessary. Monitor for any change in or worsening symptoms; fever, continued hematuria or persistent flank pain which would warrant a recheck.      ED Prescriptions     Medication Sig Dispense Auth. Provider   nitrofurantoin, macrocrystal-monohydrate, (MACROBID) 100 MG capsule Take 1 capsule (100 mg total) by mouth 2 (two) times daily. 10 capsule Kingstin Heims, Whitney L, PA      PDMP not reviewed this encounter.   Maretta Bees, [place] 11/24/21 1256

## 2021-11-26 LAB — URINE CULTURE: Culture: 60000 — AB

## 2021-12-25 ENCOUNTER — Other Ambulatory Visit: Payer: Self-pay | Admitting: Dermatology

## 2021-12-25 DIAGNOSIS — L308 Other specified dermatitis: Secondary | ICD-10-CM

## 2022-01-07 ENCOUNTER — Other Ambulatory Visit: Payer: Self-pay | Admitting: Dermatology

## 2022-01-07 DIAGNOSIS — L308 Other specified dermatitis: Secondary | ICD-10-CM

## 2022-01-16 ENCOUNTER — Ambulatory Visit: Payer: Medicaid Other | Admitting: Dermatology

## 2022-01-16 DIAGNOSIS — L308 Other specified dermatitis: Secondary | ICD-10-CM

## 2022-01-16 DIAGNOSIS — L2081 Atopic neurodermatitis: Secondary | ICD-10-CM | POA: Diagnosis not present

## 2022-01-16 DIAGNOSIS — Z79899 Other long term (current) drug therapy: Secondary | ICD-10-CM

## 2022-01-16 DIAGNOSIS — L299 Pruritus, unspecified: Secondary | ICD-10-CM

## 2022-01-16 MED ORDER — DUPIXENT 300 MG/2ML ~~LOC~~ SOSY: 300.0000 mg | PREFILLED_SYRINGE | SUBCUTANEOUS | 5 refills | Status: DC

## 2022-01-16 MED ORDER — TRIAMCINOLONE ACETONIDE 0.1 % EX CREA: 1.0000 | TOPICAL_CREAM | Freq: Two times a day (BID) | CUTANEOUS | 3 refills | Status: DC | PRN

## 2022-01-16 NOTE — Patient Instructions (Addendum)
Topical steroids (such as triamcinolone, fluocinolone, fluocinonide, mometasone, clobetasol, halobetasol, betamethasone, hydrocortisone) can cause thinning and lightening of the skin if they are used for too long in the same area. Your physician has selected the right strength medicine for your problem and area affected on the body. Please use your medication only as directed by your physician to prevent side effects.    Due to recent changes in healthcare laws, you may see results of your pathology and/or laboratory studies on MyChart before the doctors have had a chance to review them. We understand that in some cases there may be results that are confusing or concerning to you. Please understand that not all results are received at the same time and often the doctors may need to interpret multiple results in order to provide you with the best plan of care or course of treatment. Therefore, we ask that you please give us 2 business days to thoroughly review all your results before contacting the office for clarification. Should we see a critical lab result, you will be contacted sooner.   If You Need Anything After Your Visit  If you have any questions or concerns for your doctor, please call our main line at [phone] and press option 4 to reach your doctor's medical assistant. If no one answers, please leave a voicemail as directed and we will return your call as soon as possible. Messages left after 4 pm will be answered the following business day.   You may also send us a message via MyChart. We typically respond to MyChart messages within 1-2 business days.  For prescription refills, please ask your pharmacy to contact our office. Our fax number is [phone].  If you have an urgent issue when the clinic is closed that cannot wait until the next business day, you can page your doctor at the number below.    Please note that while we do our best to be available for urgent issues outside of  office hours, we are not available 24/7.   If you have an urgent issue and are unable to reach us, you may choose to seek medical care at your doctor's office, retail clinic, urgent care center, or emergency room.  If you have a medical emergency, please immediately call 911 or go to the emergency department.  Pager Numbers  - Dr. Kowalski: [phone]  - Dr. Moye: [phone]  - Dr. Stewart: [phone]  In the event of inclement weather, please call our main line at [phone] for an update on the status of any delays or closures.  Dermatology Medication Tips: Please keep the boxes that topical medications come in in order to help keep track of the instructions about where and how to use these. Pharmacies typically print the medication instructions only on the boxes and not directly on the medication tubes.   If your medication is too expensive, please contact our office at [phone] option 4 or send us a message through MyChart.   We are unable to tell what your co-pay for medications will be in advance as this is different depending on your insurance coverage. However, we may be able to find a substitute medication at lower cost or fill out paperwork to get insurance to cover a needed medication.   If a prior authorization is required to get your medication covered by your insurance company, please allow us 1-2 business days to complete this process.  Drug prices often vary depending on where the prescription is filled and some pharmacies   may offer cheaper prices.  The website www.goodrx.com contains coupons for medications through different pharmacies. The prices here do not account for what the cost may be with help from insurance (it may be cheaper with your insurance), but the website can give you the price if you did not use any insurance.  - You can print the associated coupon and take it with your prescription to the pharmacy.  - You may also stop by our office during  regular business hours and pick up a GoodRx coupon card.  - If you need your prescription sent electronically to a different pharmacy, notify our office through [facility] MyChart or by phone at [phone] option 4.     Si Usted Necesita Algo Despus de Su Visita  Tambin puede enviarnos un mensaje a travs de MyChart. Por lo general respondemos a los mensajes de MyChart en el transcurso de 1 a 2 das hbiles.  Para renovar recetas, por favor pida a su farmacia que se ponga en contacto con nuestra oficina. Nuestro nmero de fax es el [phone].  Si tiene un asunto urgente cuando la clnica est cerrada y que no puede esperar hasta el siguiente da hbil, puede llamar/localizar a su doctor(a) al nmero que aparece a continuacin.   Por favor, tenga en cuenta que aunque hacemos todo lo posible para estar disponibles para asuntos urgentes fuera del horario de oficina, no estamos disponibles las 24 horas del da, los 7 das de la semana.   Si tiene un problema urgente y no puede comunicarse con nosotros, puede optar por buscar atencin mdica  en el consultorio de su doctor(a), en una clnica privada, en un centro de atencin urgente o en una sala de emergencias.  Si tiene una emergencia mdica, por favor llame inmediatamente al 911 o vaya a la sala de emergencias.  Nmeros de bper  - Dr. Kowalski: [phone]  - Dra. Moye: [phone]  - Dra. Stewart: [phone]  En caso de inclemencias del tiempo, por favor llame a nuestra lnea principal al [phone] para una actualizacin sobre el estado de cualquier retraso o cierre.  Consejos para la medicacin en dermatologa: Por favor, guarde las cajas en las que vienen los medicamentos de uso tpico para ayudarle a seguir las instrucciones sobre dnde y cmo usarlos. Las farmacias generalmente imprimen las instrucciones del medicamento slo en las cajas y no directamente en los tubos del medicamento.   Si su medicamento es muy  caro, por favor, pngase en contacto con nuestra oficina llamando al [phone] y presione la opcin 4 o envenos un mensaje a travs de MyChart.   No podemos decirle cul ser su copago por los medicamentos por adelantado ya que esto es diferente dependiendo de la cobertura de su seguro. Sin embargo, es posible que podamos encontrar un medicamento sustituto a menor costo o llenar un formulario para que el seguro cubra el medicamento que se considera necesario.   Si se requiere una autorizacin previa para que su compaa de seguros cubra su medicamento, por favor permtanos de 1 a 2 das hbiles para completar este proceso.  Los precios de los medicamentos varan con frecuencia dependiendo del lugar de dnde se surte la receta y alguna farmacias pueden ofrecer precios ms baratos.  El sitio web www.goodrx.com tiene cupones para medicamentos de diferentes farmacias. Los precios aqu no tienen en cuenta lo que podra costar con la ayuda del seguro (puede ser ms barato con su seguro), pero el sitio web puede darle el precio si no   utiliz ningn seguro.  - Puede imprimir el cupn correspondiente y llevarlo con su receta a la farmacia.  - Tambin puede pasar por nuestra oficina durante el horario de atencin regular y recoger una tarjeta de cupones de GoodRx.  - Si necesita que su receta se enve electrnicamente a una farmacia diferente, informe a nuestra oficina a travs de MyChart de [place] o por telfono llamando al [phone] y presione la opcin 4.  

## 2022-01-16 NOTE — Progress Notes (Signed)
   Follow-Up Visit   Subjective  Kim Schmidt is a 46 y.o. female who presents for the following: Dermatitis (Pt here to discuss atopic dermatitis on hands and legs, treating with Dupixent injections every 2 weeks and Triamcinolone cream with a good response). Patient 4 days without her regular Dupixent dose and now her skin it starting to itch.  The following portions of the chart were reviewed this encounter and updated as appropriate:   Tobacco  Allergies  Meds  Problems  Med Hx  Surg Hx  Fam Hx     Review of Systems:  No other skin or systemic complaints except as noted in HPI or Assessment and Plan.  Objective  Well appearing patient in no apparent distress; mood and affect are within normal limits.  A focused examination was performed including hands and legs . Relevant physical exam findings are noted in the Assessment and Plan.  hands, legs Mainly clear skin    Assessment & Plan  Atopic neurodermatitis hands, legs  Chronic and persistent condition with duration or expected duration over one year. Condition is bothersome/symptomatic for patient. Currently flared.  Atopic dermatitis - Severe, on Dupixent (biologic medication).  Atopic dermatitis (eczema) is a chronic, relapsing, pruritic condition that can significantly affect quality of life. It is often associated with allergic rhinitis and/or asthma and can require treatment with topical medications, phototherapy, or in severe cases a biologic medication called Dupixent, which requires long term medication management.    Continue Dupixent injection once every 2 weeks  Sample of Dupixent 150 mg given Lot [id number] Exp 02/2024 Continue Triamcinolone cream qd-bid prn flares only Avoid applying to face, groin, and axilla. Use as directed. Long-term use can cause thinning of the skin.  Topical steroids (such as triamcinolone, fluocinolone, fluocinonide, mometasone, clobetasol, halobetasol, betamethasone,  hydrocortisone) can cause thinning and lightening of the skin if they are used for too long in the same area. Your physician has selected the right strength medicine for your problem and area affected on the body. Please use your medication only as directed by your physician to prevent side effects.     Related Medications triamcinolone cream (KENALOG) 0.1 % Apply 1 Application topically 2 (two) times daily as needed.  dupilumab (DUPIXENT) 300 MG/2ML prefilled syringe Inject 300 mg into the skin every 14 (fourteen) days. Starting at day 15 for maintenance.  Other eczema  Related Medications tacrolimus (PROTOPIC) 0.1 % ointment Apply topically 2 (two) times daily.  Dupilumab (DUPIXENT) 300 MG/2ML SOPN Inject 300 mg into the skin every 14 (fourteen) days. Starting at day 15 for maintenance.   Return in about 6 months (around 07/19/2022) for Atopic dermatitis-Dupixent .  IMarye Round, CMA, am acting as scribe for Sarina Ser, MD .  Documentation: I have reviewed the above documentation for accuracy and completeness, and I agree with the above.  Sarina Ser, MD

## 2022-01-22 ENCOUNTER — Encounter: Payer: Self-pay | Admitting: Dermatology

## 2022-03-11 ENCOUNTER — Ambulatory Visit
Admission: RE | Admit: 2022-03-11 | Discharge: 2022-03-11 | Disposition: A | Discharge status: Home / Self Care | Payer: Medicaid Other | Source: Ambulatory Visit | Attending: Internal Medicine | Admitting: Internal Medicine

## 2022-03-11 VITALS — BP 101/64 | HR 80 | Temp 99.3°F | Resp 16

## 2022-03-11 DIAGNOSIS — J40 Bronchitis, not specified as acute or chronic: Secondary | ICD-10-CM

## 2022-03-11 MED ORDER — GUAIFENESIN-CODEINE 200-10 MG/5ML PO LIQD: ORAL | 0 refills | Status: DC

## 2022-03-11 MED ORDER — DOXYCYCLINE MONOHYDRATE 100 MG PO CAPS: 100.0000 mg | ORAL_CAPSULE | Freq: Two times a day (BID) | ORAL | 0 refills | Status: DC

## 2022-03-11 NOTE — ED Provider Notes (Signed)
[facility]    CSN: [id number] Arrival date & time: 03/11/22  1138      History   Chief Complaint Chief Complaint  Patient presents with   Cough    Cough and chest congestion - Entered by patient    HPI Kenyon Eshleman is a 46 y.o. female who presents with onset of URI x 9 days. Started  with ST, and the next day developed a cough with no nose symptoms. Two days ago developed mild rhinitis and nose congestion.  Her cough is productive with yellow mucous, and feels worse today. She has heard herself wheezing at  times. Has been having cough attacks, and the cough is keeping her up at night time. OTC med is not helping.     Past Medical History:  Diagnosis Date   Abdominal hernia    Anxiety    Constipation 08/15/2014   Depression    Dysplastic nevus 09/25/2020   L upper back paraspinal - moderate   Esophageal reflux 08/15/2014   Family history of ovarian cancer 04/21/2012   MGM; Mom is BRCA/BART neg.   Genetic testing of female 01/2016   MYRISK neg   Hiatal hernia 08/15/2014   History of Papanicolaou smear of cervix 04/21/2012   -/-   Hyperlipidemia 04/21/2012   LGSIL on Pap smear of cervix 02/2019   Migraine    menstrual    Patient Active Problem List   Diagnosis Date Noted   Pap smear abnormality of cervix with LGSIL 04/11/2021   BRCA negative 03/22/2019   Obesity (BMI 35.0-39.9 without comorbidity) 07/06/2018   Family history of ovarian cancer 09/03/2017   Anxiety and depression 09/03/2017   Galactorrhea 09/03/2017   Perimenopausal vasomotor symptoms 09/03/2017    Past Surgical History:  Procedure Laterality Date   ABDOMINAL HYSTERECTOMY  05/14/2013   AMS; RSO; LT salpingectomy   CESAREAN SECTION  11/12/2011   twins; PJR   COLPOSCOPY  2004   PJR   KNEE ARTHROSCOPY     RIGHT   TONSILLECTOMY AND ADENOIDECTOMY     TUBAL LIGATION      OB History     Gravida  3   Para  2   Term  2   Preterm      AB  1   Living  3       SAB  1   IAB      Ectopic      Multiple  1   Live Births  3            Home Medications    Prior to Admission medications   Medication Sig Start Date End Date Taking? Authorizing Provider  ALPRAZolam (XANAX) 0.25 MG tablet Take 1 tablet (0.25 mg total) by mouth 2 (two) times daily as needed for anxiety. 8/46/96  Yes Copland, Deirdre Evener, PA-C  butalbital-acetaminophen-caffeine (FIORICET) 50-325-40 MG tablet    Yes [provider]  Butalbital-APAP-Caffeine [phone] MG capsule Take 1-2 capsules by mouth every 6 (six) hours as needed for headache. 08/25/38  Yes Copland, Elmo Putt B, PA-C  doxycycline (MONODOX) 100 MG capsule Take 1 capsule (100 mg total) by mouth 2 (two) times daily. 03/11/22  Yes Rodriguez-Southworth, Sunday Spillers, PA-C  dupilumab (DUPIXENT) 300 MG/2ML prefilled syringe Inject 300 mg into the skin every 14 (fourteen) days. Starting at day 15 for maintenance. 01/16/22  Yes Ralene Bathe, MD  guaiFENesin-Codeine 200-10 MG/5ML LIQD 5 ml qhs prn cough 03/11/22  Yes Rodriguez-Southworth, Sunday Spillers, PA-C  PARoxetine (PAXIL)  20 MG tablet TAKE 1 TABLET BY MOUTH EVERY DAY 05/10/21  Yes Imagene Riches, CNM  Dupilumab (DUPIXENT) 300 MG/2ML SOPN Inject 300 mg into the skin every 14 (fourteen) days. Starting at day 15 for maintenance. 01/02/21   Moye, [place], MD  nitrofurantoin, macrocrystal-monohydrate, (MACROBID) 100 MG capsule Take 1 capsule (100 mg total) by mouth 2 (two) times daily. 11/24/21   Crain, Whitney L, PA  sertraline (ZOLOFT) 50 MG tablet Take 1 tablet every day by oral route.    [provider]  tacrolimus (PROTOPIC) 0.1 % ointment Apply topically 2 (two) times daily. 01/02/21   Moye, [place], MD  triamcinolone cream (KENALOG) 0.1 % Apply 1 Application topically 2 (two) times daily as needed. 01/16/22   Ralene Bathe, MD    Family History Family History  Problem Relation Age of Onset   Hypertension Mother    Cancer Father 32       MELANOMA    Cancer Maternal Grandmother 28       OVARY   Breast cancer Paternal Grandmother 66       BRAIN    Social History Social History   Tobacco Use   Smoking status: Never   Smokeless tobacco: Never  Vaping Use   Vaping Use: Never used  Substance Use Topics   Alcohol use: Yes    Comment: OCC   Drug use: No     Allergies   Sulfa antibiotics   Review of Systems Review of Systems  Constitutional:  Positive for fatigue. Negative for diaphoresis and fever.  HENT:  Positive for postnasal drip and rhinorrhea. Negative for congestion, ear discharge, ear pain, sore throat and trouble swallowing.   Eyes:  Negative for discharge.  Respiratory:  Positive for cough and wheezing. Negative for chest tightness and shortness of breath.   Musculoskeletal:  Negative for myalgias.  Neurological:  Negative for headaches.  Hematological:  Negative for adenopathy.     Physical Exam Triage Vital Signs ED Triage Vitals  Enc Vitals Group     BP 03/11/22 1157 101/64     Pulse Rate 03/11/22 1157 80     Resp 03/11/22 1157 16     Temp 03/11/22 1157 99.3 F (37.4 C)     Temp Source 03/11/22 1157 Oral     SpO2 03/11/22 1157 99 %     Weight --      Height --      Head Circumference --      Peak Flow --      Pain Score 03/11/22 1156 0     Pain Loc --      Pain Edu? --      Excl. in [place]? --    No data found.  Updated Vital Signs BP 101/64 (BP Location: Left Arm)   Pulse 80   Temp 99.3 F (37.4 C) (Oral)   Resp 16   SpO2 99%   Visual Acuity Right Eye Distance:   Left Eye Distance:   Bilateral Distance:    Right Eye Near:   Left Eye Near:    Bilateral Near:     Physical Exam Vitals and nursing note reviewed.  Constitutional:      General: She is not in acute distress.    Appearance: She is obese. She is not toxic-appearing.  HENT:     Right Ear: Tympanic membrane, ear canal and external ear normal.     Left Ear: Tympanic membrane, ear canal and external ear normal.  Nose:  Congestion present.     Comments: Has mild tenderness on L maxillary sinus Eyes:     General: No scleral icterus.    Conjunctiva/sclera: Conjunctivae normal.  Cardiovascular:     Rate and Rhythm: Normal rate and regular rhythm.     Heart sounds: No murmur heard. Pulmonary:     Effort: Pulmonary effort is normal.     Breath sounds: Normal breath sounds. No wheezing.  Musculoskeletal:        General: Normal range of motion.     Cervical back: Neck supple.  Lymphadenopathy:     Cervical: No cervical adenopathy.  Skin:    General: Skin is warm and dry.  Neurological:     Mental Status: She is alert and oriented to person, place, and time.     Gait: Gait normal.  Psychiatric:        Mood and Affect: Mood normal.        Behavior: Behavior normal.        Thought Content: Thought content normal.        Judgment: Judgment normal.      UC Treatments / Results  Labs (all labs ordered are listed, but only abnormal results are displayed) Labs Reviewed - No data to display  EKG   Radiology No results found.  Procedures Procedures (including critical care time)  Medications Ordered in UC Medications - No data to display  Initial Impression / Assessment and Plan / UC Course  I have reviewed the triage vital signs and the nursing notes.  Acute bronchitis  I placed her on Doxy and Guaifenesin with codeine as noted.     Final Clinical Impressions(s) / UC Diagnoses  Acute Bronchitis  Final diagnoses:  Bronchitis   Discharge Instructions   None    ED Prescriptions     Medication Sig Dispense Auth. Provider   guaiFENesin-Codeine 200-10 MG/5ML LIQD 5 ml qhs prn cough 120 mL Rodriguez-Southworth, Sylvia, PA-C   doxycycline (MONODOX) 100 MG capsule Take 1 capsule (100 mg total) by mouth 2 (two) times daily. 20 capsule Rodriguez-Southworth, Sunday Spillers, [place]      I have reviewed the PDMP during this encounter.   Shelby Mattocks, PA-C 03/11/22 1234

## 2022-03-11 NOTE — ED Triage Notes (Signed)
Pt presents with productive cough, runny nose, chest congestion and sore throat x 1 week.

## 2022-06-10 ENCOUNTER — Other Ambulatory Visit: Payer: Self-pay

## 2022-06-10 DIAGNOSIS — F419 Anxiety disorder, unspecified: Secondary | ICD-10-CM

## 2022-06-10 DIAGNOSIS — F32A Depression, unspecified: Secondary | ICD-10-CM

## 2022-06-12 NOTE — Telephone Encounter (Signed)
Request for refill on paxil. She has not been seen in our office in over a year. Needs to make appt for a physical with Fraser Din PA (listed as her PCP). Imagene Riches, CNM  06/12/2022 2:34 PM

## 2022-06-27 ENCOUNTER — Other Ambulatory Visit: Payer: Self-pay | Admitting: Obstetrics and Gynecology

## 2022-06-27 ENCOUNTER — Encounter: Payer: Self-pay | Admitting: Obstetrics and Gynecology

## 2022-06-27 ENCOUNTER — Other Ambulatory Visit: Payer: Self-pay

## 2022-06-27 DIAGNOSIS — F32A Depression, unspecified: Secondary | ICD-10-CM

## 2022-06-28 ENCOUNTER — Other Ambulatory Visit: Payer: Self-pay | Admitting: Obstetrics

## 2022-06-28 DIAGNOSIS — F32A Depression, unspecified: Secondary | ICD-10-CM

## 2022-06-28 MED ORDER — PAROXETINE HCL 20 MG PO TABS: 20.0000 mg | ORAL_TABLET | Freq: Every day | ORAL | 5 refills | Status: DC

## 2022-06-28 NOTE — Progress Notes (Signed)
Patient is requesting a refill on her Paxil. She is over due for an Well woman exam. I have renewed the prescription. Will want to discuss her prescriber for Zoloft, as she may want to consolidate her mood medication under one prescriber.  Imagene Riches, CNM  06/28/2022 1:18 PM

## 2022-06-28 NOTE — Telephone Encounter (Signed)
Can you schedule an annual?

## 2022-07-25 ENCOUNTER — Ambulatory Visit: Payer: Medicaid Other | Admitting: Obstetrics

## 2022-08-07 ENCOUNTER — Ambulatory Visit: Payer: Medicaid Other | Admitting: Dermatology

## 2022-08-07 VITALS — BP 137/84 | HR 70

## 2022-08-07 DIAGNOSIS — L2081 Atopic neurodermatitis: Secondary | ICD-10-CM

## 2022-08-07 DIAGNOSIS — Z79899 Other long term (current) drug therapy: Secondary | ICD-10-CM | POA: Diagnosis not present

## 2022-08-07 MED ORDER — EUCRISA 2 % EX OINT: 1.0000 | TOPICAL_OINTMENT | Freq: Every day | CUTANEOUS | 5 refills | Status: DC

## 2022-08-07 MED ORDER — TRIAMCINOLONE ACETONIDE 0.1 % EX CREA
1.0000 | TOPICAL_CREAM | CUTANEOUS | 3 refills | Status: DC
Start: 2022-08-07 — End: 2023-07-31

## 2022-08-07 MED ORDER — DUPIXENT 300 MG/2ML ~~LOC~~ SOAJ: 300.0000 mg | SUBCUTANEOUS | 6 refills | Status: DC

## 2022-08-07 NOTE — Progress Notes (Signed)
   Follow-Up Visit   Subjective  Kim Schmidt is a 47 y.o. female who presents for the following: Eczema (Hands, legs, 49mf/u, itching Dupixent q 2 wks, TMC 0.1% cr ~2x/wk, pt feels like hands have beens flaring ~249mbut still better then before she started Dupixent).  The following portions of the chart were reviewed this encounter and updated as appropriate:   Tobacco  Allergies  Meds  Problems  Med Hx  Surg Hx  Fam Hx     Review of Systems:  No other skin or systemic complaints except as noted in HPI or Assessment and Plan.  Objective  Well appearing patient in no apparent distress; mood and affect are within normal limits.  A focused examination was performed including hands, legs. Relevant physical exam findings are noted in the Assessment and Plan.  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 Condition is symptomatic / bothersome to patient. Not to goal.  Recent 2 month flare but patient states she is still better then what she was prior to starting Dupixent 01/17/21   Atopic dermatitis - Severe, on Dupixent (biologic medication).  Atopic dermatitis (eczema) is a chronic, relapsing, pruritic condition that can significantly affect quality of life. It is often associated with allergic rhinitis and/or asthma and can require treatment with topical medications, phototherapy, or in severe cases biologic medications, which require long term medication management.    Discussed switching to Abbry or Cibinqo/Rinvoq  Cont Dupixent sq injections q 2 wks Start Eucrisa oint qd/bid Increase TMC 0.1% cr to 3x/wk aa hands/legs prn flares, avoid f/g/a Recommend moisturizer qd  Dupilumab (Dupixent) is a treatment given by injection for adults and children with  moderate-to-severe atopic dermatitis. Goal is control of skin condition, not cure. It is given as 2 injections at the first dose followed by 1 injection ever 2 weeks thereafter.  Young children are dosed monthly.  Potential side effects include allergic reaction, herpes infections, injection site reactions and conjunctivitis (inflammation of the eyes).  The use of Dupixent requires long term medication management, including periodic office visits.   Topical steroids (such as triamcinolone, fluocinolone, fluocinonide, mometasone, clobetasol, halobetasol, betamethasone, hydrocortisone) can cause thinning and lightening of the skin if they are used for too long in the same area. Your physician has selected the right strength medicine for your problem and area affected on the body. Please use your medication only as directed by your physician to prevent side effects.    Crisaborole (EUCRISA) 2 % OINT - hands, legs Apply 1 Application topically daily. Qd to eczema on hands, legs  Dupilumab (DUPIXENT) 300 MG/2ML SOPN - hands, legs Inject 300 mg into the skin every 14 (fourteen) days. Starting at day 15 for maintenance.  Related Medications triamcinolone cream (KENALOG) 0.1 % Apply 1 Application topically as directed. Qd up to 3 days a week to aa eczema on hands/legs until clear, then prn flares, avoid face, groin, axilla  Return in about 6 months (around 02/07/2023) for Atopic Derm.  I, SoOthelia PullingRMA, am acting as scribe for DaSarina SerMD . Documentation: I have reviewed the above documentation for accuracy and completeness, and I agree with the above.  DaSarina SerMD

## 2022-08-07 NOTE — Patient Instructions (Addendum)
Continue Dupixent injections every 2 weeks Start Eucrisa oint once daily to affected areas of eczema Increase TMC 0.1% cream to 3 days a week to affected areas of eczema on hands/legs as needed for flares, avoid face, groin, axilla Recommend moisturizer daily    Due to recent changes in healthcare laws, you may see results of your pathology and/or laboratory studies on MyChart before the doctors have had a chance to review them. We understand that in some cases there may be results that are confusing or concerning to you. Please understand that not all results are received at the same time and often the doctors may need to interpret multiple results in order to provide you with the best plan of care or course of treatment. Therefore, we ask that you please give [place] 2 business days to thoroughly review all your results before contacting the office for clarification. Should we see a critical lab result, you will be contacted sooner.   If You Need Anything After Your Visit  If you have any questions or concerns for your doctor, please call our main line at [phone] and press option 4 to reach your doctor's medical assistant. If no one answers, please leave a voicemail as directed and we will return your call as soon as possible. Messages left after 4 pm will be answered the following business day.   You may also send [place] a message via [place]. We typically respond to MyChart messages within 1-2 business days.  For prescription refills, please ask your pharmacy to contact our office. Our fax number is [phone].  If you have an urgent issue when the clinic is closed that cannot wait until the next business day, you can page your doctor at the number below.    Please note that while we do our best to be available for urgent issues outside of office hours, we are not available 24/7.   If you have an urgent issue and are unable to reach [place], you may choose to seek medical care at your doctor's  office, retail clinic, urgent care center, or emergency room.  If you have a medical emergency, please immediately call 911 or go to the emergency department.  Pager Numbers  - Dr. Nehemiah Massed: [phone]  - Dr. Laurence Ferrari: [phone]  - Dr. Nicole Kindred: [phone]  In the event of inclement weather, please call our main line at [phone] for an update on the status of any delays or closures.  Dermatology Medication Tips: Please keep the boxes that topical medications come in in order to help keep track of the instructions about where and how to use these. Pharmacies typically print the medication instructions only on the boxes and not directly on the medication tubes.   If your medication is too expensive, please contact our office at [phone] option 4 or send [place] a message through [place].   We are unable to tell what your co-pay for medications will be in advance as this is different depending on your insurance coverage. However, we may be able to find a substitute medication at lower cost or fill out paperwork to get insurance to cover a needed medication.   If a prior authorization is required to get your medication covered by your insurance company, please allow [place] 1-2 business days to complete this process.  Drug prices often vary depending on where the prescription is filled and some pharmacies may offer cheaper prices.  The website www.goodrx.com contains coupons for medications through different pharmacies. The prices here do  not account for what the cost may be with help from insurance (it may be cheaper with your insurance), but the website can give you the price if you did not use any insurance.  - You can print the associated coupon and take it with your prescription to the pharmacy.  - You may also stop by our office during regular business hours and pick up a GoodRx coupon card.  - If you need your prescription sent electronically to a different pharmacy, notify our office  through [facility] or by phone at [phone] option 4.     Si Usted Necesita Algo Despus de Su Visita  Tambin puede enviarnos un mensaje a travs de Pharmacist, community. Por lo general respondemos a los mensajes de MyChart en el transcurso de 1 a 2 das hbiles.  Para renovar recetas, por favor pida a su farmacia que se ponga en contacto con nuestra oficina. Harland Dingwall de fax es [place] [phone].  Si tiene un asunto urgente cuando la clnica est cerrada y que no puede esperar hasta el siguiente da hbil, puede llamar/localizar a su doctor(a) al nmero que aparece a continuacin.   Por favor, tenga en cuenta que aunque hacemos todo lo posible para estar disponibles para asuntos urgentes fuera del horario de [place], no estamos disponibles las 24 horas del da, los 7 das de la [place].   Si tiene un problema urgente y no puede comunicarse con nosotros, puede optar por buscar atencin mdica  en el consultorio de su doctor(a), en una clnica privada, en un centro de atencin urgente o en una sala de emergencias.  Si tiene Engineering geologist, por favor llame inmediatamente al 911 o vaya a la sala de emergencias.  Nmeros de bper  - Dr. Nehemiah Massed: [phone]  - Dra. Moye: [phone]  - Dra. Nicole Kindred: [phone]  En caso de inclemencias del [place], por favor llame a Johnsie Kindred principal al [phone] para una actualizacin sobre el [place] de cualquier retraso o cierre.  Consejos para la medicacin en dermatologa: Por favor, guarde las cajas en las que vienen los medicamentos de uso tpico para ayudarle a seguir las instrucciones sobre dnde y cmo usarlos. Las farmacias generalmente imprimen las instrucciones del medicamento slo en las cajas y no directamente en los tubos del [place].   Si su medicamento es muy caro, por favor, pngase en contacto con Zigmund Daniel llamando al [phone] y presione la opcin 4 o envenos un mensaje a travs de Pharmacist, community.   No  podemos decirle cul ser su copago por los medicamentos por adelantado ya que esto es diferente dependiendo de la cobertura de su seguro. Sin embargo, es posible que podamos encontrar un medicamento sustituto a Electrical engineer un formulario para que el seguro cubra el medicamento que se considera necesario.   Si se requiere una autorizacin previa para que su compaa de seguros [place] su medicamento, por favor permtanos de 1 a 2 das hbiles para completar este proceso.  Los precios de los medicamentos varan con frecuencia dependiendo del Environmental consultant de dnde se surte la receta y alguna farmacias pueden ofrecer precios ms baratos.  El sitio web www.goodrx.com tiene cupones para medicamentos de Airline pilot. Los precios aqu no tienen en cuenta lo que podra costar con la ayuda del seguro (puede ser ms barato con su seguro), pero el sitio web puede darle el precio si no utiliz Research scientist (physical sciences).  - Puede imprimir el cupn correspondiente y llevarlo con su receta a la farmacia.  -  Tambin puede pasar por nuestra oficina durante el horario de atencin regular y Charity fundraiser una tarjeta de cupones de GoodRx.  - Si necesita que su receta se enve electrnicamente a una farmacia diferente, informe a nuestra oficina a travs de MyChart de [place] o por telfono llamando al [phone] y presione la opcin 4.

## 2022-08-11 ENCOUNTER — Encounter: Payer: Self-pay | Admitting: Dermatology

## 2022-09-19 ENCOUNTER — Ambulatory Visit
Admission: RE | Admit: 2022-09-19 | Discharge: 2022-09-19 | Disposition: A | Discharge status: Home / Self Care | Payer: Medicaid Other | Source: Ambulatory Visit | Attending: Emergency Medicine | Admitting: Emergency Medicine

## 2022-09-19 VITALS — BP 126/82 | HR 93 | Temp 98.2°F | Resp 16

## 2022-09-19 DIAGNOSIS — G5601 Carpal tunnel syndrome, right upper limb: Secondary | ICD-10-CM | POA: Diagnosis not present

## 2022-09-19 DIAGNOSIS — G5602 Carpal tunnel syndrome, left upper limb: Secondary | ICD-10-CM

## 2022-09-19 MED ORDER — PREDNISONE 20 MG PO TABS: 40.0000 mg | ORAL_TABLET | Freq: Every day | ORAL | 0 refills | Status: DC

## 2022-09-19 MED ORDER — CYCLOBENZAPRINE HCL 10 MG PO TABS: 10.0000 mg | ORAL_TABLET | Freq: Every day | ORAL | 0 refills | Status: DC

## 2022-09-19 MED ORDER — KETOROLAC TROMETHAMINE 30 MG/ML IJ SOLN
30.0000 mg | Freq: Once | INTRAMUSCULAR | Status: AC
  Administered 2022-09-19: 30 mg via INTRAMUSCULAR

## 2022-09-19 NOTE — Discharge Instructions (Signed)
Today you are being treated for a flare of your carpal tunnel  You have been given an injection of Toradol to help reduce inflammation which ideally will help with pain and tingling, ideally will start to see relief in about 30 minutes, you may use Tylenol for the remainder of the day  Starting tomorrow take prednisone every morning with food for 5 days, you may take Tylenol 500 to 1000 mg every 6 hours in addition to this  You may use muscle relaxant at bedtime as needed for additional comfort, be mindful this will make you feel drowsy  Continue use of wrist brace to provide stability and support  May use ice or heat over the affected area in 10 to 15-minute intervals  You may elevate when sitting and lying to help reduce swelling and for general comfort  Inside your packet are exercises that you may do to strengthen the wrist and ideally minimize your flare  You may follow-up with his urgent care as needed

## 2022-09-19 NOTE — ED Triage Notes (Signed)
Pt has a history of carpal tunnel in her right hand and it has been hurting and feeling numb for the past week. She has been wearing her brace with no relief.

## 2022-09-19 NOTE — ED Provider Notes (Signed)
[facility]    CSN: [id number] Arrival date & time: 09/19/22  0912      History   Chief Complaint Chief Complaint  Patient presents with   Hand Pain    HPI Kim Schmidt is a 47 y.o. female.   Patient presents for evaluation of pain and numbness to the right hand beginning 6 days ago.  Numbness is experienced in the second third and fourth finger occurring intermittently.  Pain is primarily to the wrist, worsened when hand is lifted into the air.  Symptoms worsening at nighttime, interfering with sleep, that is when numbness is most prominent.  History of carpal tunnel and has been using brace which has been ineffective.  Additionally has been taking ibuprofen 600 mg consistently with no relief.  Denies changes in activity, new injury or trauma.      Past Medical History:  Diagnosis Date   Abdominal hernia    Anxiety    Constipation 08/15/2014   Depression    Dysplastic nevus 09/25/2020   L upper back paraspinal - moderate   Esophageal reflux 08/15/2014   Family history of ovarian cancer 04/21/2012   MGM; Mom is BRCA/BART neg.   Genetic testing of female 01/2016   MYRISK neg   Hiatal hernia 08/15/2014   History of Papanicolaou smear of cervix 04/21/2012   -/-   Hyperlipidemia 04/21/2012   LGSIL on Pap smear of cervix 02/2019   Migraine    menstrual    Patient Active Problem List   Diagnosis Date Noted   Pap smear abnormality of cervix with LGSIL 04/11/2021   BRCA negative 03/22/2019   Obesity (BMI 35.0-39.9 without comorbidity) 07/06/2018   Family history of ovarian cancer 09/03/2017   Anxiety and depression 09/03/2017   Galactorrhea 09/03/2017   Perimenopausal vasomotor symptoms 09/03/2017    Past Surgical History:  Procedure Laterality Date   ABDOMINAL HYSTERECTOMY  05/14/2013   AMS; RSO; LT salpingectomy   CESAREAN SECTION  11/12/2011   twins; PJR   COLPOSCOPY  2004   PJR   KNEE ARTHROSCOPY     RIGHT   TONSILLECTOMY AND  ADENOIDECTOMY     TUBAL LIGATION      OB History     Gravida  3   Para  2   Term  2   Preterm      AB  1   Living  3      SAB  1   IAB      Ectopic      Multiple  1   Live Births  3            Home Medications    Prior to Admission medications   Medication Sig Start Date End Date Taking? Authorizing Provider  ALPRAZolam (XANAX) 0.25 MG tablet Take 1 tablet (0.25 mg total) by mouth 2 (two) times daily as needed for anxiety. 02/16/20   Copland, Ilona Sorrel, PA-C  butalbital-acetaminophen-caffeine (FIORICET) [phone] MG tablet     [provider]  Butalbital-APAP-Caffeine [phone] MG capsule Take 1-2 capsules by mouth every 6 (six) hours as needed for headache. 02/16/20   Copland, Ilona Sorrel, PA-C  Crisaborole (EUCRISA) 2 % OINT Apply 1 Application topically daily. Qd to eczema on hands, legs 08/07/22   Deirdre Evener, MD  doxycycline (MONODOX) 100 MG capsule Take 1 capsule (100 mg total) by mouth 2 (two) times daily. 03/11/22   Rodriguez-Southworth, Nettie Elm, PA-C  Dupilumab (DUPIXENT) 300 MG/2ML SOPN Inject 300 mg into the  skin every 14 (fourteen) days. Starting at day 15 for maintenance. 08/07/22   Deirdre Evener, MD  guaiFENesin-Codeine 200-10 MG/5ML LIQD 5 ml qhs prn cough 03/11/22   Rodriguez-Southworth, Nettie Elm, PA-C  nitrofurantoin, macrocrystal-monohydrate, (MACROBID) 100 MG capsule Take 1 capsule (100 mg total) by mouth 2 (two) times daily. 11/24/21   Crain, Alphonzo Lemmings L, PA  PARoxetine (PAXIL) 20 MG tablet TAKE 1 TABLET BY MOUTH EVERY DAY 06/28/22   Mirna Mires, CNM  PARoxetine (PAXIL) 20 MG tablet Take 1 tablet (20 mg total) by mouth daily. 06/28/22   Mirna Mires, CNM  sertraline (ZOLOFT) 50 MG tablet Take 1 tablet every day by oral route.    [provider]  triamcinolone cream (KENALOG) 0.1 % Apply 1 Application topically as directed. Qd up to 3 days a week to aa eczema on hands/legs until clear, then prn flares, avoid face, groin, axilla  08/07/22   Deirdre Evener, MD    Family History Family History  Problem Relation Age of Onset   Hypertension Mother    Cancer Father 69       MELANOMA   Cancer Maternal Grandmother 76       OVARY   Breast cancer Paternal Grandmother 26       BRAIN    Social History Social History   Tobacco Use   Smoking status: Never   Smokeless tobacco: Never  Vaping Use   Vaping Use: Never used  Substance Use Topics   Alcohol use: Yes    Comment: OCC   Drug use: No     Allergies   Sulfa antibiotics   Review of Systems Review of Systems   Physical Exam Triage Vital Signs ED Triage Vitals  Enc Vitals Group     BP 09/19/22 1014 126/82     Pulse Rate 09/19/22 1014 93     Resp 09/19/22 1014 16     Temp 09/19/22 1014 98.2 F (36.8 C)     Temp Source 09/19/22 1014 Oral     SpO2 09/19/22 1014 95 %     Weight --      Height --      Head Circumference --      Peak Flow --      Pain Score 09/19/22 1013 6     Pain Loc --      Pain Edu? --      Excl. in GC? --    No data found.  Updated Vital Signs BP 126/82 (BP Location: Left Arm)   Pulse 93   Temp 98.2 F (36.8 C) (Oral)   Resp 16   SpO2 95%   Visual Acuity Right Eye Distance:   Left Eye Distance:   Bilateral Distance:    Right Eye Near:   Left Eye Near:    Bilateral Near:     Physical Exam Constitutional:      Appearance: Normal appearance.  HENT:     Head: Normocephalic.  Eyes:     Extraocular Movements: Extraocular movements intact.  Pulmonary:     Effort: Pulmonary effort is normal.  Musculoskeletal:     Comments: Positive Tinel's sign, negative Phalen's, has full range of motion of the wrist and all 5 digits, 2+ radial pulse, sensation is intact, capillary refill less than 3  Neurological:     Mental Status: She is alert and oriented to person, place, and time.      UC Treatments / Results  Labs (all labs ordered are listed, but  only abnormal results are displayed) Labs Reviewed - No data  to display  EKG   Radiology No results found.  Procedures Procedures (including critical care time)  Medications Ordered in UC Medications - No data to display  Initial Impression / Assessment and Plan / UC Course  I have reviewed the triage vital signs and the nursing notes.  Pertinent labs & imaging results that were available during my care of the patient were reviewed by me and considered in my medical decision making (see chart for details).  Carpal tunnel syndrome, right  Presentation and symptomology is consistent with above diagnoses, patient has known history, as NSAIDs and wrist brace have been ineffective will attempt use of steroids, Toradol injection given in office and prescribed prednisone and Flexeril for outpatient use, recommended continued use of brace as well as ice and heat, elevation and activity as tolerated, given written handout of exercises to help strengthen the tendon may follow-up with his urgent care as needed Final Clinical Impressions(s) / UC Diagnoses   Final diagnoses:  None   Discharge Instructions   None    ED Prescriptions   None    PDMP not reviewed this encounter.   Valinda Hoar, NP 09/19/22 1941

## 2022-10-18 ENCOUNTER — Ambulatory Visit: Payer: Medicaid Other | Admitting: Obstetrics

## 2022-12-30 ENCOUNTER — Ambulatory Visit: Payer: Medicaid Other | Admitting: Obstetrics
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
## 2023-02-18 ENCOUNTER — Ambulatory Visit: Payer: Medicaid Other | Admitting: Dermatology

## 2023-07-03 ENCOUNTER — Ambulatory Visit
Admission: RE | Admit: 2023-07-03 | Discharge: 2023-07-03 | Disposition: A | Discharge status: Home / Self Care | Payer: Medicaid Other | Source: Ambulatory Visit | Attending: Family Medicine | Admitting: Family Medicine

## 2023-07-03 ENCOUNTER — Ambulatory Visit (INDEPENDENT_AMBULATORY_CARE_PROVIDER_SITE_OTHER): Payer: Medicaid Other

## 2023-07-03 VITALS — BP 139/84 | HR 87 | Temp 98.2°F | Ht 63.0 in | Wt 230.0 lb

## 2023-07-03 DIAGNOSIS — R509 Fever, unspecified: Secondary | ICD-10-CM | POA: Insufficient documentation

## 2023-07-03 DIAGNOSIS — J101 Influenza due to other identified influenza virus with other respiratory manifestations: Secondary | ICD-10-CM | POA: Insufficient documentation

## 2023-07-03 DIAGNOSIS — J989 Respiratory disorder, unspecified: Secondary | ICD-10-CM | POA: Diagnosis present

## 2023-07-03 LAB — RESP PANEL BY RT-PCR (RSV, FLU A&B, COVID)  RVPGX2
Influenza A by PCR: POSITIVE — AB
Influenza B by PCR: NEGATIVE
Resp Syncytial Virus by PCR: NEGATIVE
SARS Coronavirus 2 by RT PCR: NEGATIVE

## 2023-07-03 NOTE — ED Provider Notes (Signed)
[facility]    CSN: [id number] Arrival date & time: 07/03/23  1209      History   Chief Complaint Chief Complaint  Patient presents with   Fever   Cough   Ear Pain    HPI Kenzy Campoverde is a 48 y.o. female.   HPI  History obtained from the patient. Milayna presents for cough, rhinorrhea, chest congestion with yellow sputum that started on Sunday. Yesterday, she started having vomiting and had fever.  Tmax 101.5 F.  She has ear congestion and diarrhea.   Took Advil and Robitussin with some relief about 2 hours ago. She has some shortness of breath.  Not able to taste or smell anything.  Her daughter is sick too but her COVID test was negative and she believes she has the flu.      Past Medical History:  Diagnosis Date   Abdominal hernia    Anxiety    Constipation 08/15/2014   Depression    Dysplastic nevus 09/25/2020   L upper back paraspinal - moderate   Esophageal reflux 08/15/2014   Family history of ovarian cancer 04/21/2012   MGM; Mom is BRCA/BART neg.   Genetic testing of female 01/2016   MYRISK neg   Hiatal hernia 08/15/2014   History of Papanicolaou smear of cervix 04/21/2012   -/-   Hyperlipidemia 04/21/2012   LGSIL on Pap smear of cervix 02/2019   Migraine    menstrual    Patient Active Problem List   Diagnosis Date Noted   Pap smear abnormality of cervix with LGSIL 04/11/2021   BRCA negative 03/22/2019   Obesity (BMI 35.0-39.9 without comorbidity) 07/06/2018   Family history of ovarian cancer 09/03/2017   Anxiety and depression 09/03/2017   Galactorrhea 09/03/2017   Perimenopausal vasomotor symptoms 09/03/2017    Past Surgical History:  Procedure Laterality Date   ABDOMINAL HYSTERECTOMY  05/14/2013   AMS; [facility]SO; LT salpingectomy   CESAREAN SECTION  11/12/2011   twins; [facility]   COLPOSCOPY  2004   [facility]   KNEE ARTHROSCOPY     RIGHT   TONSILLECTOMY AND ADENOIDECTOMY     TUBAL LIGATION      OB History     Gravida  3    Para  2   Term  2   Preterm      AB  1   Living  3      SAB  1   IAB      Ectopic      Multiple  1   Live Births  3            Home Medications    Prior to Admission medications   Medication Sig Start Date End Date Taking? Authorizing Provider  Crisaborole (EUCRISA) 2 % OINT Apply 1 Application topically daily. Qd to eczema on hands, legs 08/07/22  Yes Deirdre Evener, MD  cyclobenzaprine (FLEXERIL) 10 MG tablet Take 1 tablet (10 mg total) by mouth at bedtime. 09/19/22  Yes White, Adrienne R, NP  Dupilumab (DUPIXENT) 300 MG/2ML SOPN Inject 300 mg into the skin every 14 (fourteen) days. Starting at day 15 for maintenance. 08/07/22  Yes Deirdre Evener, MD  PARoxetine (PAXIL) 20 MG tablet TAKE 1 TABLET BY MOUTH EVERY DAY 06/28/22  Yes Mirna Mires, CNM  PARoxetine (PAXIL) 20 MG tablet Take 1 tablet (20 mg total) by mouth daily. 06/28/22  Yes Mirna Mires, CNM  sertraline (ZOLOFT) 50 MG tablet Take 1 tablet every  day by oral route.   Yes [provider]  triamcinolone cream (KENALOG) 0.1 % Apply 1 Application topically as directed. Qd up to 3 days a week to aa eczema on hands/legs until clear, then prn flares, avoid face, groin, axilla 08/07/22  Yes Deirdre Evener, MD  ALPRAZolam Prudy Feeler) 0.25 MG tablet Take 1 tablet (0.25 mg total) by mouth 2 (two) times daily as needed for anxiety. 02/16/20   Copland, Ilona Sorrel, PA-C  butalbital-acetaminophen-caffeine (FIORICET) [phone] MG tablet     [provider]  Butalbital-APAP-Caffeine [phone] MG capsule Take 1-2 capsules by mouth every 6 (six) hours as needed for headache. 02/16/20   Copland, Ilona Sorrel, PA-C  doxycycline (MONODOX) 100 MG capsule Take 1 capsule (100 mg total) by mouth 2 (two) times daily. 03/11/22   Rodriguez-Southworth, Nettie Elm, PA-C  guaiFENesin-Codeine 200-10 MG/5ML LIQD 5 ml qhs prn cough 03/11/22   Rodriguez-Southworth, Nettie Elm, PA-C  nitrofurantoin, macrocrystal-monohydrate, (MACROBID)  100 MG capsule Take 1 capsule (100 mg total) by mouth 2 (two) times daily. 11/24/21   Crain, Whitney L, PA  predniSONE (DELTASONE) 20 MG tablet Take 2 tablets (40 mg total) by mouth daily. 09/19/22   Valinda Hoar, NP    Family History Family History  Problem Relation Age of Onset   Hypertension Mother    Cancer Father 16       MELANOMA   Cancer Maternal Grandmother 75       OVARY   Breast cancer Paternal Grandmother 41       BRAIN    Social History Social History   Tobacco Use   Smoking status: Never   Smokeless tobacco: Never  Vaping Use   Vaping status: Never Used  Substance Use Topics   Alcohol use: Yes    Comment: OCC   Drug use: No     Allergies   Sulfa antibiotics   Review of Systems Review of Systems: negative unless otherwise stated in HPI.      Physical Exam Triage Vital Signs ED Triage Vitals  Encounter Vitals Group     BP 07/03/23 1238 139/84     Systolic BP Percentile --      Diastolic BP Percentile --      Pulse Rate 07/03/23 1238 87     Resp --      Temp 07/03/23 1238 98.2 F (36.8 C)     Temp Source 07/03/23 1238 Oral     SpO2 07/03/23 1238 96 %     Weight 07/03/23 1237 230 lb (104.3 kg)     Height 07/03/23 1237 5\' 3"  (1.6 m)     Head Circumference --      Peak Flow --      Pain Score 07/03/23 1237 0     Pain Loc --      Pain Education --      Exclude from Growth Chart --    No data found.  Updated Vital Signs BP 139/84 (BP Location: Left Arm)   Pulse 87   Temp 98.2 F (36.8 C) (Oral)   Ht 5\' 3"  (1.6 m)   Wt 104.3 kg   SpO2 96%   BMI 40.74 kg/m   Visual Acuity Right Eye Distance:   Left Eye Distance:   Bilateral Distance:    Right Eye Near:   Left Eye Near:    Bilateral Near:     Physical Exam GEN:     alert, ill but non-toxic appearing female in no distress    HENT:  mucus membranes moist, oropharyngeal without lesions or erythema, no tonsillar hypertrophy or exudates, clear nasal discharge EYES:   no scleral  injection or discharge NECK:  normal ROM, no meningismus   RESP:  no increased work of breathing, coarse breath sounds bilaterally  CVS:   regular rate and rhythm Skin:   warm and dry, no rash on visible skin    UC Treatments / Results  Labs (all labs ordered are listed, but only abnormal results are displayed) Labs Reviewed  RESP PANEL BY RT-PCR (RSV, FLU A&B, COVID)  RVPGX2 - Abnormal; Notable for the following components:      Result Value   Influenza A by PCR POSITIVE (*)    All other components within normal limits    EKG   Radiology DG Chest 2 View Result Date: 07/03/2023 CLINICAL DATA:  Fever and cough. EXAM: CHEST - 2 VIEW COMPARISON:  Chest radiograph dated 07/20/2014 FINDINGS: The heart size and mediastinal contours are within normal limits. Both lungs are clear. The visualized skeletal structures are unremarkable. IMPRESSION: No active cardiopulmonary disease. Electronically Signed   By: Elgie Collard M.D.   On: 07/03/2023 13:29    Procedures Procedures (including critical care time)  Medications Ordered in UC Medications - No data to display  Initial Impression / Assessment and Plan / UC Course  I have reviewed the triage vital signs and the nursing notes.  Pertinent labs & imaging results that were available during my care of the patient were reviewed by me and considered in my medical decision making (see chart for details).       Pt is a 48 y.o. female who presents for 5  days of respiratory symptoms with new onset fever. Ryonna is afebrile here . Satting well on room air. Overall pt is ill but non-toxic appearing, well hydrated, without respiratory distress. Pulmonary exam is remarkable coarse breath sounds bilaterally.  Recommended chest x-ray to ensure she does not have pneumonia and she is agreeable.  COVID and influenza panel obtained and was influenza A positive.   Chest xray personally reviewed by me without focal pneumonia, pleural effusion,  cardiomegaly or pneumothorax. Radiologist impression reviewed.  She is outside the window for Tamiflu.  Discussed symptomatic treatment.  Explained lack of efficacy of antibiotics in viral disease.  Typical duration of symptoms discussed.  Work note provided.  Return and ED precautions given and voiced understanding. Discussed MDM, treatment plan and plan for follow-up with patient who agrees with plan.     Final Clinical Impressions(s) / UC Diagnoses   Final diagnoses:  Respiratory illness with fever  Influenza A     Discharge Instructions      You have influenza A. Your symptoms will gradually improve over the next 7 to 10 days.  The cough may last about 3 weeks.   Take ibuprofen 600 mg with Tylenol 1000 mg for fever, headache or body aches.   For cough:  You can also use guaifenesin and dextromethorphan for cough. You can use a humidifier for chest congestion and cough.  If you don't have a humidifier, you can sit in the bathroom with the hot shower running.      For sore throat: try warm salt water gargles, Mucinex sore throat cough drops or cepacol lozenges, throat spray, warm tea or water with lemon/honey, popsicles or ice, or OTC cold relief medicine for throat discomfort. You can also purchase chloraseptic spray at the pharmacy or dollar store.   For congestion: take a daily  anti-histamine like Zyrtec, Claritin, and a oral decongestant, such as pseudoephedrine.  You can also use Flonase 1-2 sprays in each nostril daily. Afrin is also a good option, if you do not have high blood pressure.    It is important to stay hydrated: drink plenty of fluids (water, gatorade/powerade/pedialyte, juices, or teas) to keep your throat moisturized and help further relieve irritation/discomfort.    Return or go to the Emergency Department if symptoms worsen or do not improve in the next few days      ED Prescriptions   None    PDMP not reviewed this encounter.   Katha Cabal,  DO 07/03/23 1941

## 2023-07-03 NOTE — Discharge Instructions (Signed)
You have influenza A.  Your symptoms will gradually improve over the next 7 to 10 days.  The cough may last about 3 weeks.   Take ibuprofen 600 mg with Tylenol 1000 mg for fever, headache or body aches.   For cough: You can also use guaifenesin and dextromethorphan for cough. You can use a humidifier for chest congestion and cough.  If you don't have a humidifier, you can sit in the bathroom with the hot shower running.      For sore throat: try warm salt water gargles, Mucinex sore throat cough drops or cepacol lozenges, throat spray, warm tea or water with lemon/honey, popsicles or ice, or OTC cold relief medicine for throat discomfort. You can also purchase chloraseptic spray at the pharmacy or dollar store.   For congestion: take a daily anti-histamine like Zyrtec, Claritin, and a oral decongestant, such as pseudoephedrine.  You can also use Flonase 1-2 sprays in each nostril daily. Afrin is also a good option, if you do not have high blood pressure.    It is important to stay hydrated: drink plenty of fluids (water, gatorade/powerade/pedialyte, juices, or teas) to keep your throat moisturized and help further relieve irritation/discomfort.    Return or go to the Emergency Department if symptoms worsen or do not improve in the next few days

## 2023-07-03 NOTE — ED Triage Notes (Signed)
Pt c/o cough, ear pain x5days vomiting, temperature 101.5 x2days  Pt has had ibuprofen for cough 2 hours ago.

## 2023-07-31 ENCOUNTER — Ambulatory Visit: Payer: Medicaid Other | Admitting: Dermatology

## 2023-07-31 DIAGNOSIS — L2081 Atopic neurodermatitis: Secondary | ICD-10-CM

## 2023-07-31 DIAGNOSIS — L578 Other skin changes due to chronic exposure to nonionizing radiation: Secondary | ICD-10-CM

## 2023-07-31 DIAGNOSIS — D2262 Melanocytic nevi of left upper limb, including shoulder: Secondary | ICD-10-CM

## 2023-07-31 DIAGNOSIS — Z7189 Other specified counseling: Secondary | ICD-10-CM

## 2023-07-31 DIAGNOSIS — L820 Inflamed seborrheic keratosis: Secondary | ICD-10-CM

## 2023-07-31 DIAGNOSIS — L814 Other melanin hyperpigmentation: Secondary | ICD-10-CM | POA: Diagnosis not present

## 2023-07-31 DIAGNOSIS — Z79899 Other long term (current) drug therapy: Secondary | ICD-10-CM

## 2023-07-31 DIAGNOSIS — W908XXA Exposure to other nonionizing radiation, initial encounter: Secondary | ICD-10-CM

## 2023-07-31 DIAGNOSIS — D489 Neoplasm of uncertain behavior, unspecified: Secondary | ICD-10-CM

## 2023-07-31 DIAGNOSIS — D229 Melanocytic nevi, unspecified: Secondary | ICD-10-CM

## 2023-07-31 DIAGNOSIS — Z86018 Personal history of other benign neoplasm: Secondary | ICD-10-CM

## 2023-07-31 DIAGNOSIS — Z1283 Encounter for screening for malignant neoplasm of skin: Secondary | ICD-10-CM | POA: Diagnosis not present

## 2023-07-31 DIAGNOSIS — L821 Other seborrheic keratosis: Secondary | ICD-10-CM

## 2023-07-31 DIAGNOSIS — D1801 Hemangioma of skin and subcutaneous tissue: Secondary | ICD-10-CM

## 2023-07-31 DIAGNOSIS — D492 Neoplasm of unspecified behavior of bone, soft tissue, and skin: Secondary | ICD-10-CM | POA: Diagnosis not present

## 2023-07-31 MED ORDER — EUCRISA 2 % EX OINT
1.0000 | TOPICAL_OINTMENT | Freq: Every day | CUTANEOUS | 5 refills | Status: AC
Start: 2023-07-31 — End: ?

## 2023-07-31 MED ORDER — TRIAMCINOLONE ACETONIDE 0.1 % EX CREA
1.0000 | TOPICAL_CREAM | CUTANEOUS | 6 refills | Status: AC
Start: 2023-07-31 — End: ?

## 2023-07-31 NOTE — Progress Notes (Signed)
 Follow-Up Visit   Subjective  Kim Schmidt is a 48 y.o. female who presents for the following: Skin Cancer Screening and Full Body Skin Exam hx of dysplastic , hx of eczema at hands   The patient presents for Total-Body Skin Exam (TBSE) for skin cancer screening and mole check. The patient has spots, moles and lesions to be evaluated, some may be new or changing and the patient may have concern these could be cancer.  The following portions of the chart were reviewed this encounter and updated as appropriate: medications, allergies, medical history  Review of Systems:  No other skin or systemic complaints except as noted in HPI or Assessment and Plan.  Objective  Well appearing patient in no apparent distress; mood and affect are within normal limits.  A full examination was performed including scalp, head, eyes, ears, nose, lips, neck, chest, axillae, abdomen, back, buttocks, bilateral upper extremities, bilateral lower extremities, hands, feet, fingers, toes, fingernails, and toenails. All findings within normal limits unless otherwise noted below.   Relevant physical exam findings are noted in the Assessment and Plan.  left lateral bicep x 1 Erythematous stuck-on, waxy papule or plaque left lateral distal deltoid 0.6  x 0.3 cm irregular brown macule - see below photo taken AFTER shave removal performed   Assessment & Plan   SKIN CANCER SCREENING PERFORMED TODAY.  ACTINIC DAMAGE - Chronic condition, secondary to cumulative UV/sun exposure - diffuse scaly erythematous macules with underlying dyspigmentation - Recommend daily broad spectrum sunscreen SPF 30+ to sun-exposed areas, reapply every 2 hours as needed.  - Staying in the shade or wearing long sleeves, sun glasses (UVA+UVB protection) and wide brim hats (4-inch brim around the entire circumference of the hat) are also recommended for sun protection.  - Call for new or changing lesions.  LENTIGINES,  SEBORRHEIC KERATOSES, HEMANGIOMAS - Benign normal skin lesions - Benign-appearing - Call for any changes  MELANOCYTIC NEVI - Tan-brown and/or pink-flesh-colored symmetric macules and papules - Benign appearing on exam today - Observation - Call clinic for new or changing moles - Recommend daily use of broad spectrum spf 30+ sunscreen to sun-exposed areas.   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 Condition is improving with treatment but not currently at goal.  Hx of flares at hands and legs. Started Dupixent 01/17/2021 per Dr Neale Burly   Atopic dermatitis - Severe, on Dupixent (biologic medication).  Atopic dermatitis (eczema) is a chronic, relapsing, pruritic condition that can significantly affect quality of life. It is often associated with allergic rhinitis and/or asthma and can require treatment with topical medications, phototherapy, or in severe cases biologic medications, which require long term medication management.     Patient stopped Dupixent 300 mg injection due to injection site reactions.   Discussed Abbry, Numlivio, or Cibinqo / Rinvoq.  Patient deferred starting another treatment today. Condition has improved. Will consider consider options discuss in future if needed.   Continue Eucrisa oint qd/bid Continue TMC 0.1% cr to 3x/wk aa hands/legs prn flares, avoid f/g/a Recommend moisturizer qd   Dupilumab (Dupixent) is a treatment given by injection for adults and children with moderate-to-severe atopic dermatitis. Goal is control of skin condition, not cure. It is given as 2 injections at the first dose followed by 1 injection ever 2 weeks thereafter.  Young  children are dosed monthly.   Potential side effects include allergic reaction, herpes infections, injection site  reactions and conjunctivitis (inflammation of the eyes).  The use of Dupixent requires long term medication management, including periodic office visits.    Topical steroids (such as triamcinolone, fluocinolone, fluocinonide, mometasone, clobetasol, halobetasol, betamethasone, hydrocortisone) can cause thinning and lightening of the skin if they are used for too long in the same area. Your physician has selected the right strength medicine for your problem and area affected on the body. Please use your medication only as directed by your physician to prevent side effects.  HISTORY OF DYSPLASTIC NEVUS Left upper back paraspinal - moderate  09/25/2020 No evidence of recurrence today Recommend regular full body skin exams Recommend daily broad spectrum sunscreen SPF 30+ to sun-exposed areas, reapply every 2 hours as needed.  Call if any new or changing lesions are noted between office visits  INFLAMED SEBORRHEIC KERATOSIS left lateral bicep x 1 Symptomatic, irritating, patient would like treated. Destruction of lesion - left lateral bicep x 1 Complexity: simple   Destruction method: cryotherapy   Informed consent: discussed and consent obtained   Timeout:  patient name, date of birth, surgical site, and procedure verified Lesion destroyed using liquid nitrogen: Yes   Region frozen until ice ball extended beyond lesion: Yes   Outcome: patient tolerated procedure well with no complications   Post-procedure details: wound care instructions given   ATOPIC NEURODERMATITIS   Related Medications Dupilumab (DUPIXENT) 300 MG/2ML SOPN Inject 300 mg into the skin every 14 (fourteen) days. Starting at day 15 for maintenance. Crisaborole (EUCRISA) 2 % OINT Apply 1 Application topically daily. Qd to eczema on hands, legs triamcinolone cream (KENALOG) 0.1 % Apply 1 Application topically as directed. Qd up to 3 days a week to aa eczema on hands/legs until clear, then prn flares, avoid face, groin,  axilla NEOPLASM OF UNCERTAIN BEHAVIOR left lateral distal deltoid Epidermal / dermal shaving  Lesion diameter (cm):  0.6 Informed consent: discussed and consent obtained   Timeout: patient name, date of birth, surgical site, and procedure verified   Procedure prep:  Patient was prepped and draped in usual sterile fashion Prep type:  Isopropyl alcohol Anesthesia: the lesion was anesthetized in a standard fashion   Anesthetic:  1% lidocaine w/ epinephrine 1-100,000 buffered w/ 8.4% NaHCO3 Instrument used: flexible razor blade   Hemostasis achieved with: pressure, aluminum chloride and electrodesiccation   Outcome: patient tolerated procedure well   Post-procedure details: sterile dressing applied and wound care instructions given   Dressing type: bandage and petrolatum   Specimen 1 - Surgical pathology Differential Diagnosis: irregular nevus r/o dyplasia   Check Margins: No Irregular nevus r/o dysplasia  COUNSELING AND COORDINATION OF CARE   MEDICATION MANAGEMENT   LENTIGO   MELANOCYTIC NEVUS, UNSPECIFIED LOCATION   SKIN CANCER SCREENING   HISTORY OF DYSPLASTIC NEVUS   Return in about 1 year (around 07/30/2024) for TBSE.  IAsher Muir, CMA, am acting as scribe for Armida Sans, MD.   Documentation: I have reviewed the above documentation for accuracy and completeness, and I agree with the above.  Armida Sans, MD

## 2023-07-31 NOTE — Patient Instructions (Addendum)
 Eczema at hands  Continue Eucrisa ointment to eczema areas at hands and body daily to twice daily   Continue triamcinolone 0.1 % cream to affected areas of eczema 3 days weekly as needed for flares. Avoid applying to face, groin, and axilla. Use as directed. Long-term use can cause thinning of the skin.  Topical steroids (such as triamcinolone, fluocinolone, fluocinonide, mometasone, clobetasol, halobetasol, betamethasone, hydrocortisone) can cause thinning and lightening of the skin if they are used for too long in the same area. Your physician has selected the right strength medicine for your problem and area affected on the body. Please use your medication only as directed by your physician to prevent side effects.      Biopsy Wound Care Instructions  Leave the original bandage on for 24 hours if possible.  If the bandage becomes soaked or soiled before that time, it is OK to remove it and examine the wound.  A small amount of post-operative bleeding is normal.  If excessive bleeding occurs, remove the bandage, place gauze over the site and apply continuous pressure (no peeking) over the area for 30 minutes. If this does not work, please call our clinic as soon as possible or page your doctor if it is after hours.   Once a day, cleanse the wound with soap and water. It is fine to shower. If a thick crust develops you may use a Q-tip dipped into dilute hydrogen peroxide (mix 1:1 with water) to dissolve it.  Hydrogen peroxide can slow the healing process, so use it only as needed.    After washing, apply petroleum jelly (Vaseline) or an antibiotic ointment if your doctor prescribed one for you, followed by a bandage.    For best healing, the wound should be covered with a layer of ointment at all times. If you are not able to keep the area covered with a bandage to hold the ointment in place, this may mean re-applying the ointment several times a day.  Continue this wound care until the wound  has healed and is no longer open.   Itching and mild discomfort is normal during the healing process. However, if you develop pain or severe itching, please call our office.   If you have any discomfort, you can take Tylenol (acetaminophen) or ibuprofen as directed on the bottle. (Please do not take these if you have an allergy to them or cannot take them for another reason).  Some redness, tenderness and white or yellow material in the wound is normal healing.  If the area becomes very sore and red, or develops a thick yellow-green material (pus), it may be infected; please notify us.    If you have stitches, return to clinic as directed to have the stitches removed. You will continue wound care for 2-3 days after the stitches are removed.   Wound healing continues for up to one year following surgery. It is not unusual to experience pain in the scar from time to time during the interval.  If the pain becomes severe or the scar thickens, you should notify the office.    A slight amount of redness in a scar is expected for the first six months.  After six months, the redness will fade and the scar will soften and fade.  The color difference becomes less noticeable with time.  If there are any problems, return for a post-op surgery check at your earliest convenience.  To improve the appearance of the scar, you can use silicone  scar gel, cream, or sheets (such as Mederma or Serica) every night for up to one year. These are available over the counter (without a prescription).  Please call our office at [phone] for any questions or concerns.     Melanoma ABCDEs  Melanoma is the most dangerous type of skin cancer, and is the leading cause of death from skin disease.  You are more likely to develop melanoma if you: Have light-colored skin, light-colored eyes, or red or blond hair Spend a lot of time in the sun Tan regularly, either outdoors or in a tanning bed Have had blistering sunburns,  especially during childhood Have a close family member who has had a melanoma Have atypical moles or large birthmarks  Early detection of melanoma is key since treatment is typically straightforward and cure rates are extremely high if we catch it early.   The first sign of melanoma is often a change in a mole or a new dark spot.  The ABCDE system is a way of remembering the signs of melanoma.  A for asymmetry:  The two halves do not match. B for border:  The edges of the growth are irregular. C for color:  A mixture of colors are present instead of an even brown color. D for diameter:  Melanomas are usually (but not always) greater than 6mm - the size of a pencil eraser. E for evolution:  The spot keeps changing in size, shape, and color.  Please check your skin once per month between visits. You can use a small mirror in front and a large mirror behind you to keep an eye on the back side or your body.   If you see any new or changing lesions before your next follow-up, please call to schedule a visit.  Please continue daily skin protection including broad spectrum sunscreen SPF 30+ to sun-exposed areas, reapplying every 2 hours as needed when you're outdoors.   Staying in the shade or wearing long sleeves, sun glasses (UVA+UVB protection) and wide brim hats (4-inch brim around the entire circumference of the hat) are also recommended for sun protection.    Due to recent changes in healthcare laws, you may see results of your pathology and/or laboratory studies on MyChart before the doctors have had a chance to review them. We understand that in some cases there may be results that are confusing or concerning to you. Please understand that not all results are received at the same time and often the doctors may need to interpret multiple results in order to provide you with the best plan of care or course of treatment. Therefore, we ask that you please give [place] 2 business days to thoroughly  review all your results before contacting the office for clarification. Should we see a critical lab result, you will be contacted sooner.   If You Need Anything After Your Visit  If you have any questions or concerns for your doctor, please call our main line at [phone] and press option 4 to reach your doctor's medical assistant. If no one answers, please leave a voicemail as directed and we will return your call as soon as possible. Messages left after 4 pm will be answered the following business day.   You may also send [place] a message via MyChart. We typically respond to MyChart messages within 1-2 business days.  For prescription refills, please ask your pharmacy to contact our office. Our fax number is [phone].  If you have an urgent issue when  the clinic is closed that cannot wait until the next business day, you can page your doctor at the number below.    Please note that while we do our best to be available for urgent issues outside of office hours, we are not available 24/7.   If you have an urgent issue and are unable to reach [place], you may choose to seek medical care at your doctor's office, retail clinic, urgent care center, or emergency room.  If you have a medical emergency, please immediately call 911 or go to the emergency department.  Pager Numbers  - Dr. Gwen Pounds: [phone]  - Dr. Roseanne Reno: [phone]  - Dr. Katrinka Blazing: [phone]   In the event of inclement weather, please call our main line at [phone] for an update on the status of any delays or closures.  Dermatology Medication Tips: Please keep the boxes that topical medications come in in order to help keep track of the instructions about where and how to use these. Pharmacies typically print the medication instructions only on the boxes and not directly on the medication tubes.   If your medication is too expensive, please contact our office at [phone] option 4 or send [place] a message through  MyChart.   We are unable to tell what your co-pay for medications will be in advance as this is different depending on your insurance coverage. However, we may be able to find a substitute medication at lower cost or fill out paperwork to get insurance to cover a needed medication.   If a prior authorization is required to get your medication covered by your insurance company, please allow [place] 1-2 business days to complete this process.  Drug prices often vary depending on where the prescription is filled and some pharmacies may offer cheaper prices.  The website www.goodrx.com contains coupons for medications through different pharmacies. The prices here do not account for what the cost may be with help from insurance (it may be cheaper with your insurance), but the website can give you the price if you did not use any insurance.  - You can print the associated coupon and take it with your prescription to the pharmacy.  - You may also stop by our office during regular business hours and pick up a GoodRx coupon card.  - If you need your prescription sent electronically to a different pharmacy, notify our office through [facility] or by phone at [phone] option 4.     Si Usted Necesita Algo Despus de Su Visita  Tambin puede enviarnos un mensaje a travs de Clinical cytogeneticist. Por lo general respondemos a los mensajes de MyChart en el transcurso de 1 a 2 das hbiles.  Para renovar recetas, por favor pida a su farmacia que se ponga en contacto con nuestra oficina. Annie Sable de fax es [place] [phone].  Si tiene un asunto urgente cuando la clnica est cerrada y que no puede esperar hasta el siguiente da hbil, puede llamar/localizar a su doctor(a) al nmero que aparece a continuacin.   Por favor, tenga en cuenta que aunque hacemos todo lo posible para estar disponibles para asuntos urgentes fuera del horario de [place], no estamos disponibles las 24 horas del da, los 7 [address]  [address] de la  [place].   Si tiene un problema urgente y no puede comunicarse con nosotros, puede optar por buscar atencin mdica  en el consultorio de su doctor(a), en una clnica privada, en un centro de atencin urgente o en una sala de emergencias.  Si  tiene una emergencia mdica, por favor llame inmediatamente al 911 o vaya a la sala de emergencias.  Nmeros de bper  - Dr. Gwen Pounds: [phone]  - Dra. Roseanne Reno: [phone]  - Dr. Katrinka Blazing: [phone]   En caso de inclemencias del tiempo, por favor llame a Lacy Duverney principal al [phone] para una actualizacin sobre el [place] de cualquier retraso o cierre.  Consejos para la medicacin en dermatologa: Por favor, guarde las cajas en las que vienen los medicamentos de uso tpico para ayudarle a seguir las instrucciones sobre dnde y cmo usarlos. Las farmacias generalmente imprimen las instrucciones del medicamento slo en las cajas y no directamente en los tubos del [place].   Si su medicamento es muy caro, por favor, pngase en contacto con Rolm Gala llamando al [phone] y presione la opcin 4 o envenos un mensaje a travs de Clinical cytogeneticist.   No podemos decirle cul ser su copago por los medicamentos por adelantado ya que esto es diferente dependiendo de la cobertura de su seguro. Sin embargo, es posible que podamos encontrar un medicamento sustituto a Audiological scientist un formulario para que el seguro cubra el medicamento que se considera necesario.   Si se requiere una autorizacin previa para que su compaa de seguros [place] su medicamento, por favor permtanos de 1 a 2 das hbiles para completar [address]  Los precios de los medicamentos varan con frecuencia dependiendo del Environmental consultant de dnde se surte la receta y alguna farmacias pueden ofrecer precios ms baratos.  El sitio web www.goodrx.com tiene cupones para medicamentos de Health and safety inspector. Los precios aqu no tienen en cuenta lo que podra costar con la ayuda  del seguro (puede ser ms barato con su seguro), pero el sitio web puede darle el precio si no utiliz Tourist information centre manager.  - Puede imprimir el cupn correspondiente y llevarlo con su receta a la farmacia.  - Tambin puede pasar por nuestra oficina durante el horario de atencin regular y Education officer, museum una tarjeta de cupones de GoodRx.  - Si necesita que su receta se enve electrnicamente a una farmacia diferente, informe a nuestra oficina a travs de MyChart de [place] o por telfono llamando al [phone] y presione la opcin 4.

## 2023-08-04 ENCOUNTER — Encounter: Payer: Self-pay | Admitting: Dermatology

## 2023-08-05 ENCOUNTER — Telehealth: Payer: Self-pay

## 2023-08-05 ENCOUNTER — Encounter: Payer: Self-pay | Admitting: Dermatology

## 2023-08-05 LAB — SURGICAL PATHOLOGY

## 2023-08-05 NOTE — Telephone Encounter (Addendum)
 Called and discussed results with patient. She verbalized understanding. Patient scheduled for 6 month follow up to recheck area explained may need additional treatment at follow up. Patient denied further questions.    ----- Message from Armida Sans sent at 08/05/2023  5:53 PM EST ----- FINAL DIAGNOSIS        1. Skin, left lateral distal deltoid :       DYSPLASTIC NEVUS WITH MODERATE TO SEVERE ATYPIA, CLOSE TO MARGIN, SEE       DESCRIPTION   Moderate to severe dysplastic Margins clear but "close to margin" May need additional procedure Recheck 6 mos - make pt 6 mos appt

## 2023-09-27 ENCOUNTER — Telehealth

## 2023-09-28 NOTE — Progress Notes (Unsigned)
 PCP:  Matthias Sor, PA-C   No chief complaint on file.    HPI:      [name] is a 48 y.o. [phone] who LMP was No LMP recorded. Patient has had a hysterectomy., presents today for her annual examination.  Her menses are absent due to hyst/RSO.   Dysmenorrhea none. She does not have intermenstrual bleeding. Has hot flashes and bad night sweats. Hx of migraines about 3 times yearly now, lasting about a week. Takes fioricet prn sx with relief.    Sex activity: not sexually active. No vag sx. Last Pap: 04/11/21 Results were: no abnormalities /POS HPV DNA, repeat pap due Hx of STDs: none  Last mammogram: not recently There is no FH of breast cancer. There is a FH of ovarian cancer in her MGM. Pt's mom is BRCA 1 and 2 neg. Pt is MyRisk neg 8/17.  The patient does do self-breast exams.  Pt notes painful cyst under LT breast for a few days. Had probable LT breast cyst 8/19 that "popped" on its own last yr so pt never went for mammogram or u/s. Sx resolved until recurrence a few days ago. Area is red, painful, hot to touch. No fevers/chills. Using warm and cold compresses. No d/c yet.  Tobacco use: The patient denies current or previous tobacco use. Alcohol use: none No drug use.  Exercise: not active  She does get adequate calcium and Vitamin D in her diet.  Low Vit D 01/2016, taking supp now.   Pt also on paxil  20 mg (up from 10 mg last yr) for anxiety/depression sx with sx relief.  Wants to cont meds. No side effects.    Past Medical History:  Diagnosis Date   Abdominal hernia    Anxiety    Constipation 08/15/2014   Depression    Dysplastic nevus 09/25/2020   L upper back paraspinal - moderate   Dysplastic nevus 07/31/2023   left lateral distal deltoid - moderate to severe atypia - may need additional treatment  will recheck at 6 month follow   Esophageal reflux 08/15/2014   Family history of ovarian cancer 04/21/2012   MGM; Mom is BRCA/BART neg.    Genetic testing of female 01/2016   MYRISK neg   Hiatal hernia 08/15/2014   History of Papanicolaou smear of cervix 04/21/2012   -/-   Hyperlipidemia 04/21/2012   LGSIL on Pap smear of cervix 02/2019   Migraine    menstrual    Past Surgical History:  Procedure Laterality Date   ABDOMINAL HYSTERECTOMY  05/14/2013   AMS; RSO; LT salpingectomy   CESAREAN SECTION  11/12/2011   twins; PJR   COLPOSCOPY  2004   P[facility]   KNEE ARTHROSCOPY     RIGHT   TONSILLECTOMY AND ADENOIDECTOMY     TUBAL LIGATION      Family History  Problem Relation Age of Onset   Hypertension Mother    Cancer Father 75       MELANOMA   Cancer Maternal Grandmother 39       OVARY   Breast cancer Paternal Grandmother 69       BRAIN    Social History   Socioeconomic History   Marital status: Divorced    Spouse name: Not on file   Number of children: Not on file   Years of education: Not on file   Highest education level: Not on file  Occupational History   Not on file  Tobacco Use  Smoking status: Never   Smokeless tobacco: Never  Vaping Use   Vaping status: Never Used  Substance and Sexual Activity   Alcohol use: Yes    Comment: OCC   Drug use: No   Sexual activity: Yes    Partners: Male    Birth control/protection: Surgical    Comment: Hysterectomy  Other Topics Concern   Not on file  Social History Narrative   Not on file   Social Drivers of Health   Financial Resource Strain: Not on file  Food Insecurity: Not on file  Transportation Needs: Not on file  Physical Activity: Not on file  Stress: Not on file  Social Connections: Unknown (10/01/2021)   [facility]   Social Network    Social Network: Not on file  Intimate Partner Violence: Unknown (09/07/2021)   [facility]   HITS    Physically Hurt: Not on file    Insult or Talk Down To: Not on file    Threaten Physical Harm: Not on file    Scream or Curse: Not on file     Outpatient Medications Prior to Visit  Medication Sig Dispense Refill   ALPRAZolam  (XANAX ) 0.25 MG tablet Take 1 tablet (0.25 mg total) by mouth 2 (two) times daily as needed for anxiety. 30 tablet 0   butalbital -acetaminophen -caffeine  (FIORICET) 50-325-40 MG tablet      Butalbital -APAP-Caffeine  50-325-40 MG capsule Take 1-2 capsules by mouth every 6 (six) hours as needed for headache. 20 capsule 0   Crisaborole  (EUCRISA ) 2 % OINT Apply 1 Application topically daily. Qd to eczema on hands, legs 100 g 5   cyclobenzaprine  (FLEXERIL ) 10 MG tablet Take 1 tablet (10 mg total) by mouth at bedtime. 10 tablet 0   Dupilumab  (DUPIXENT ) 300 MG/2ML SOPN Inject 300 mg into the skin every 14 (fourteen) days. Starting at day 15 for maintenance. 4 mL 6   nitrofurantoin , macrocrystal-monohydrate, (MACROBID ) 100 MG capsule Take 1 capsule (100 mg total) by mouth 2 (two) times daily. 10 capsule 0   PARoxetine  (PAXIL ) 20 MG tablet TAKE 1 TABLET BY MOUTH EVERY DAY 90 tablet 5   PARoxetine  (PAXIL ) 20 MG tablet Take 1 tablet (20 mg total) by mouth daily. 90 tablet 5   sertraline (ZOLOFT) 50 MG tablet Take 1 tablet every day by oral route.     triamcinolone  cream (KENALOG ) 0.1 % Apply 1 Application topically as directed. Qd up to 3 days a week to aa eczema on hands/legs until clear, then prn flares, avoid face, groin, axilla 80 g 6   No facility-administered medications prior to visit.     ROS:  Review of Systems  Constitutional:  Negative for fatigue, fever and unexpected weight change.  Respiratory:  Negative for cough, shortness of breath and wheezing.   Cardiovascular:  Negative for chest pain, palpitations and leg swelling.  Gastrointestinal:  Negative for blood in stool, constipation, diarrhea, nausea and vomiting.  Endocrine: Negative for cold intolerance, heat intolerance and polyuria.  Genitourinary:  Negative for dyspareunia, dysuria, flank pain, frequency, genital sores, hematuria, menstrual  problem, pelvic pain, urgency, vaginal bleeding, vaginal discharge and vaginal pain.  Musculoskeletal:  Negative for back pain, joint swelling and myalgias.  Skin:  Negative for rash.  Neurological:  Positive for headaches. Negative for dizziness, syncope, light-headedness and numbness.  Hematological:  Negative for adenopathy.  Psychiatric/Behavioral:  Negative for agitation, confusion, dysphoric mood, sleep disturbance and suicidal ideas. The patient is not nervous/anxious.  BREAST: No symptoms   Objective: There were no vitals taken for this visit.   Physical Exam Constitutional:      Appearance: She is well-developed.  Genitourinary:     Vulva normal.     Genitourinary Comments: UTERUS/CX SURG REM     No vaginal discharge, erythema or tenderness.      Right Adnexa: absent.    Right Adnexa: not tender and no mass present.    Left Adnexa: not tender and no mass present.    Cervix is absent.     Uterus is absent.  Breasts:    Right: No mass, nipple discharge, skin change or tenderness.     Left: Mass, skin change and tenderness present. No nipple discharge.  Neck:     Thyroid: No thyromegaly.  Cardiovascular:     Rate and Rhythm: Normal rate and regular rhythm.     Heart sounds: Normal heart sounds. No murmur heard. Pulmonary:     Effort: Pulmonary effort is normal.     Breath sounds: Normal breath sounds.  Chest:    Abdominal:     Palpations: Abdomen is soft.     Tenderness: There is no abdominal tenderness. There is no guarding.  Musculoskeletal:        General: Normal range of motion.     Cervical back: Normal range of motion.  Neurological:     General: No focal deficit present.     Mental Status: She is alert and oriented to person, place, and time.     Cranial Nerves: No cranial nerve deficit.  Skin:    General: Skin is warm and dry.  Psychiatric:        Mood and Affect: Mood normal.        Behavior: Behavior normal.        Thought Content: Thought  content normal.        Judgment: Judgment normal.  Vitals reviewed.     Assessment/Plan: Encounter for annual routine gynecological examination  Cervical cancer screening - Plan: Cytology - PAP  Screening for HPV (human papillomavirus) - Plan: Cytology - PAP  Screening for breast cancer--Pt to sched mammo  Anxiety and depression - Plan: PARoxetine  (PAXIL ) 20 MG tablet; Rx RF paxil . F/u prn.   Cutaneous abscess of chest wall - Plan: doxycycline  (VIBRAMYCIN ) 100 MG capsule; Small area, better to drain than I&D. Warm compresses/try abx since sx recurred. F/u prn.   Needs flu shot - Plan: Flu Vaccine QUAD 36+ mos IM (Fluarix, Quad PF)  No orders of the defined types were placed in this encounter.            GYN counsel breast self exam, mammography screening, menopause, adequate intake of calcium and vitamin D, diet and exercise     F/U  No follow-ups on file.  Saahil Herbster B. Choya Tornow, PA-C 09/28/2023 8:35 PM

## 2023-09-29 ENCOUNTER — Ambulatory Visit (INDEPENDENT_AMBULATORY_CARE_PROVIDER_SITE_OTHER): Admitting: Obstetrics and Gynecology

## 2023-09-29 ENCOUNTER — Other Ambulatory Visit (HOSPITAL_COMMUNITY)
Admission: RE | Admit: 2023-09-29 | Discharge: 2023-09-29 | Disposition: A | Discharge status: Home / Self Care | Source: Ambulatory Visit | Attending: Obstetrics and Gynecology | Admitting: Obstetrics and Gynecology

## 2023-09-29 ENCOUNTER — Encounter: Payer: Self-pay | Admitting: Obstetrics and Gynecology

## 2023-09-29 VITALS — BP 107/66 | HR 79 | Ht 63.0 in | Wt 249.0 lb

## 2023-09-29 DIAGNOSIS — Z1272 Encounter for screening for malignant neoplasm of vagina: Secondary | ICD-10-CM

## 2023-09-29 DIAGNOSIS — R87810 Cervical high risk human papillomavirus (HPV) DNA test positive: Secondary | ICD-10-CM | POA: Diagnosis present

## 2023-09-29 DIAGNOSIS — Z1151 Encounter for screening for human papillomavirus (HPV): Secondary | ICD-10-CM

## 2023-09-29 DIAGNOSIS — Z01419 Encounter for gynecological examination (general) (routine) without abnormal findings: Secondary | ICD-10-CM | POA: Diagnosis not present

## 2023-09-29 DIAGNOSIS — Z1231 Encounter for screening mammogram for malignant neoplasm of breast: Secondary | ICD-10-CM

## 2023-09-29 DIAGNOSIS — N951 Menopausal and female climacteric states: Secondary | ICD-10-CM

## 2023-09-29 DIAGNOSIS — F32A Depression, unspecified: Secondary | ICD-10-CM

## 2023-09-29 DIAGNOSIS — Z1211 Encounter for screening for malignant neoplasm of colon: Secondary | ICD-10-CM

## 2023-09-29 NOTE — Addendum Note (Signed)
 Addended by: Alyn Judge B on: 09/29/2023 05:21 PM   Modules accepted: Orders

## 2023-09-29 NOTE — Patient Instructions (Signed)
 I value your feedback and you entrusting [place] with your care. If you get a [facility] patient survey, I would appreciate you taking the time to let us know about your experience today. Thank you!  [facility] ([place])--[phone]

## 2023-10-02 ENCOUNTER — Encounter: Payer: Self-pay | Admitting: Obstetrics and Gynecology

## 2023-10-02 LAB — CYTOLOGY - PAP
Adequacy: ABSENT
Comment: NEGATIVE
High risk HPV: POSITIVE — AB

## 2023-10-21 ENCOUNTER — Encounter: Payer: Self-pay | Admitting: Obstetrics and Gynecology

## 2023-10-22 ENCOUNTER — Other Ambulatory Visit: Payer: Self-pay | Admitting: Obstetrics and Gynecology

## 2023-10-22 DIAGNOSIS — F32A Depression, unspecified: Secondary | ICD-10-CM

## 2023-10-22 MED ORDER — PAROXETINE HCL 20 MG PO TABS: ORAL_TABLET | ORAL | 3 refills | Status: AC

## 2023-10-22 NOTE — Progress Notes (Signed)
 Rx RF paxil  to restart due to anxiety

## 2023-11-18 ENCOUNTER — Telehealth: Payer: Self-pay | Admitting: Obstetrics and Gynecology

## 2023-11-18 NOTE — Telephone Encounter (Signed)
 The patient contacted the office and call was dropped. I contacted the patient no answer, left voicemail for an returned call.

## 2023-11-18 NOTE — Telephone Encounter (Signed)
 Contacted the patient via phone. The patient needs an colposcopy appointment. Calling to offer next available for 8/6 with Dr Luster Salters. I left voicemail for the patient to contact the office for scheduling.

## 2023-12-01 NOTE — Telephone Encounter (Signed)
I contacted patient via phone. I left voicemail for patient to call back to be scheduled.   

## 2024-01-15 NOTE — Telephone Encounter (Signed)
 Ok

## 2024-02-10 ENCOUNTER — Ambulatory Visit: Admitting: Dermatology

## 2024-05-15 ENCOUNTER — Ambulatory Visit
Admission: EM | Admit: 2024-05-15 | Discharge: 2024-05-15 | Disposition: A | Discharge status: Home / Self Care | Attending: Emergency Medicine | Admitting: Emergency Medicine

## 2024-05-15 ENCOUNTER — Encounter: Payer: Self-pay | Admitting: Emergency Medicine

## 2024-05-15 DIAGNOSIS — J069 Acute upper respiratory infection, unspecified: Secondary | ICD-10-CM

## 2024-05-15 DIAGNOSIS — Z20822 Contact with and (suspected) exposure to covid-19: Secondary | ICD-10-CM

## 2024-05-15 LAB — POC COVID19/FLU A&B COMBO
Covid Antigen, POC: NEGATIVE
Influenza A Antigen, POC: NEGATIVE
Influenza B Antigen, POC: NEGATIVE

## 2024-05-15 MED ORDER — BENZONATATE 100 MG PO CAPS: 200.0000 mg | ORAL_CAPSULE | Freq: Three times a day (TID) | ORAL | 0 refills | Status: DC

## 2024-05-15 MED ORDER — PROMETHAZINE-DM 6.25-15 MG/5ML PO SYRP: 5.0000 mL | ORAL_SOLUTION | Freq: Four times a day (QID) | ORAL | 0 refills | Status: DC | PRN

## 2024-05-15 MED ORDER — IPRATROPIUM BROMIDE 0.06 % NA SOLN: 2.0000 | Freq: Four times a day (QID) | NASAL | 12 refills | Status: DC

## 2024-05-15 NOTE — ED Triage Notes (Signed)
 Patient c/o cough and congestion and runny nose that started 4 days ago.  Patient states that she was exposed to flu.  Patient states that she had loss of taste today.  Patient would like a COVID test.

## 2024-05-15 NOTE — ED Provider Notes (Signed)
 [facility]    CSN: [id number] Arrival date & time: 05/15/24  1412      History   Chief Complaint Chief Complaint  Patient presents with   Cough    HPI Kim Schmidt is a 48 y.o. female.   HPI  48 year old female with past medical history significant for GERD, anxiety, perimenopausal vasomotor symptoms, and hiatal hernia presents for evaluation of respiratory symptoms that began 4 days ago.  She reports that she was exposed to the flu but she came in today because she lost her sense of smell and taste and she is requesting a COVID test.  She has not had any associated fever but she does endorse ear pain.  Her cough has been productive for yellow mucus.  No shortness of breath or wheezing.  Past Medical History:  Diagnosis Date   Abdominal hernia    Anxiety    Constipation 08/15/2014   Depression    Dysplastic nevus 09/25/2020   L upper back paraspinal - moderate   Dysplastic nevus 07/31/2023   left lateral distal deltoid - moderate to severe atypia - may need additional treatment  will recheck at 6 month follow   Esophageal reflux 08/15/2014   Family history of ovarian cancer 04/21/2012   MGM; Mom is BRCA/BART neg.   Genetic testing of female 01/2016   MYRISK neg   Hiatal hernia 08/15/2014   History of Papanicolaou smear of cervix 04/21/2012   -/-   Hyperlipidemia 04/21/2012   LGSIL on Pap smear of cervix 02/2019   Migraine    menstrual    Patient Active Problem List   Diagnosis Date Noted   Pap smear abnormality of cervix with LGSIL 04/11/2021   BRCA negative 03/22/2019   Obesity (BMI 35.0-39.9 without comorbidity) 07/06/2018   Family history of ovarian cancer 09/03/2017   Anxiety and depression 09/03/2017   Galactorrhea 09/03/2017   Perimenopausal vasomotor symptoms 09/03/2017    Past Surgical History:  Procedure Laterality Date   ABDOMINAL HYSTERECTOMY  05/14/2013   AMS; RSO; LT salpingectomy   CESAREAN SECTION  11/12/2011    twins; PJR   COLPOSCOPY  2004   PJR   KNEE ARTHROSCOPY     RIGHT   TONSILLECTOMY AND ADENOIDECTOMY     TUBAL LIGATION      OB History     Gravida  3   Para  2   Term  2   Preterm      AB  1   Living  3      SAB  1   IAB      Ectopic      Multiple  1   Live Births  3            Home Medications    Prior to Admission medications  Medication Sig Start Date End Date Taking? Authorizing Provider  benzonatate  (TESSALON ) 100 MG capsule Take 2 capsules (200 mg total) by mouth every 8 (eight) hours. 05/15/24  Yes Bernardino Ditch, NP  ipratropium (ATROVENT ) 0.06 % nasal spray Place 2 sprays into both nostrils 4 (four) times daily. 05/15/24  Yes Bernardino Ditch, NP  PARoxetine  (PAXIL ) 20 MG tablet TAKE 1/2 TABLET DAILY FOR 6 DAYS, THEN 1 TABLET BY MOUTH EVERY DAY 10/22/23  Yes Copland, Alicia B, PA-C  promethazine -dextromethorphan (PROMETHAZINE -DM) 6.25-15 MG/5ML syrup Take 5 mLs by mouth 4 (four) times daily as needed. 05/15/24  Yes Bernardino Ditch, NP  Crisaborole  (EUCRISA ) 2 % OINT Apply 1 Application topically daily.  Qd to eczema on hands, legs 07/31/23   Hester Alm BROCKS, MD  triamcinolone  cream (KENALOG ) 0.1 % Apply 1 Application topically as directed. Qd up to 3 days a week to aa eczema on hands/legs until clear, then prn flares, avoid face, groin, axilla 07/31/23   Hester Alm BROCKS, MD    Family History Family History  Problem Relation Age of Onset   Hypertension Mother    Cancer Father 64       MELANOMA   Cancer Maternal Grandmother 76       OVARY   Breast cancer Paternal Grandmother 63       BRAIN    Social History Social History[1]   Allergies   Sulfa  antibiotics   Review of Systems Review of Systems  Constitutional:  Negative for fever.  HENT:  Positive for congestion, ear pain and rhinorrhea. Negative for sore throat.   Respiratory:  Positive for cough. Negative for shortness of breath and wheezing.      Physical Exam Triage Vital Signs ED  Triage Vitals  Encounter Vitals Group     BP      Girls Systolic BP Percentile      Girls Diastolic BP Percentile      Boys Systolic BP Percentile      Boys Diastolic BP Percentile      Pulse      Resp      Temp      Temp src      SpO2      Weight      Height      Head Circumference      Peak Flow      Pain Score      Pain Loc      Pain Education      Exclude from Growth Chart    No data found.  Updated Vital Signs BP (!) 141/96 (BP Location: Left Arm)   Pulse 73   Temp 97.8 F (36.6 C) (Oral)   Resp 15   Ht 5' 3 (1.6 m)   Wt 248 lb 14.4 oz (112.9 kg)   SpO2 98%   BMI 44.09 kg/m   Visual Acuity Right Eye Distance:   Left Eye Distance:   Bilateral Distance:    Right Eye Near:   Left Eye Near:    Bilateral Near:     Physical Exam Vitals and nursing note reviewed.  Constitutional:      Appearance: Normal appearance. She is not ill-appearing.  HENT:     Head: Normocephalic and atraumatic.     Right Ear: Tympanic membrane, ear canal and external ear normal. There is no impacted cerumen.     Left Ear: Tympanic membrane, ear canal and external ear normal. There is no impacted cerumen.     Nose: Congestion and rhinorrhea present.     Comments: Nasal mucosa is erythematous and edematous with clear discharge in both nares.    Mouth/Throat:     Mouth: Mucous membranes are moist.     Pharynx: Oropharynx is clear. No oropharyngeal exudate or posterior oropharyngeal erythema.  Cardiovascular:     Rate and Rhythm: Normal rate and regular rhythm.     Pulses: Normal pulses.     Heart sounds: Normal heart sounds. No murmur heard.    No friction rub. No gallop.  Pulmonary:     Effort: Pulmonary effort is normal.     Breath sounds: Normal breath sounds. No wheezing, rhonchi or rales.  Musculoskeletal:  Cervical back: Normal range of motion and neck supple. No tenderness.  Lymphadenopathy:     Cervical: No cervical adenopathy.  Skin:    General: Skin is warm and  dry.     Capillary Refill: Capillary refill takes less than 2 seconds.     Findings: No rash.  Neurological:     General: No focal deficit present.     Mental Status: She is alert and oriented to person, place, and time.      UC Treatments / Results  Labs (all labs ordered are listed, but only abnormal results are displayed) Labs Reviewed  POC COVID19/FLU A&B COMBO - Normal    EKG   Radiology No results found.  Procedures Procedures (including critical care time)  Medications Ordered in UC Medications - No data to display  Initial Impression / Assessment and Plan / UC Course  I have reviewed the triage vital signs and the nursing notes.  Pertinent labs & imaging results that were available during my care of the patient were reviewed by me and considered in my medical decision making (see chart for details).   Patient is a nontoxic-appearing 48 year old female presenting for evaluation of 4 days with respiratory symptoms outlined in HPI above.  As mentioned above, she was exposed to influenza but she would like to be tested for COVID because she lost her sense of taste and smell today.  I did explain to her that she can have the loss of taste and smell with other respiratory viruses besides COVID.  We would be happy to test her for COVID given her cluster of symptoms.  I will order a COVID antigen test.  Respiratory panel is negative for COVID and influenza.  I will discharge patient diagnosis of viral URI with a cough.  Prescribe Atrovent  nasal spray for nasal congestion and Tessalon  Perles and Promethazine  DM cough syrup for cough and congestion.   Final Clinical Impressions(s) / UC Diagnoses   Final diagnoses:  Exposure to COVID-19 virus  Viral URI with cough     Discharge Instructions      Your test was negative for COVID 2-day.  I do feel that you have a respiratory virus which is causing your symptoms.  Please use over-the-counter Tylenol  and or ibuprofen   according to the package instructions as needed for any fever or pain.  Use the Atrovent  nasal spray, 2 squirts in each nostril every 6 hours, as needed for runny nose and postnasal drip.  Use the Tessalon  Perles every 8 hours during the day.  Take them with a small sip of water.  They may give you some numbness to the base of your tongue or a metallic taste in your mouth, this is normal.  Use the Promethazine  DM cough syrup at bedtime for cough and congestion.  It will make you drowsy so do not take it during the day.  Return for reevaluation or see your primary care provider for any new or worsening symptoms.      ED Prescriptions     Medication Sig Dispense Auth. Provider   benzonatate  (TESSALON ) 100 MG capsule Take 2 capsules (200 mg total) by mouth every 8 (eight) hours. 21 capsule Bernardino Ditch, NP   ipratropium (ATROVENT ) 0.06 % nasal spray Place 2 sprays into both nostrils 4 (four) times daily. 15 mL Bernardino Ditch, NP   promethazine -dextromethorphan (PROMETHAZINE -DM) 6.25-15 MG/5ML syrup Take 5 mLs by mouth 4 (four) times daily as needed. 118 mL Bernardino Ditch, NP  PDMP not reviewed this encounter.     [1]  Social History Tobacco Use   Smoking status: Never   Smokeless tobacco: Never  Vaping Use   Vaping status: Never Used  Substance Use Topics   Alcohol use: Yes    Comment: OCC   Drug use: No     Bernardino Ditch, NP 05/15/24 1500

## 2024-05-15 NOTE — Discharge Instructions (Addendum)
 Your test was negative for COVID 2-day.  I do feel that you have a respiratory virus which is causing your symptoms.  Please use over-the-counter Tylenol  and or ibuprofen  according to the package instructions as needed for any fever or pain.  Use the Atrovent  nasal spray, 2 squirts in each nostril every 6 hours, as needed for runny nose and postnasal drip.  Use the Tessalon  Perles every 8 hours during the day.  Take them with a small sip of water.  They may give you some numbness to the base of your tongue or a metallic taste in your mouth, this is normal.  Use the Promethazine  DM cough syrup at bedtime for cough and congestion.  It will make you drowsy so do not take it during the day.  Return for reevaluation or see your primary care provider for any new or worsening symptoms.

## 2024-06-03 ENCOUNTER — Encounter: Payer: Self-pay | Admitting: Obstetrics and Gynecology

## 2024-06-04 ENCOUNTER — Other Ambulatory Visit: Payer: Self-pay

## 2024-06-04 DIAGNOSIS — M549 Dorsalgia, unspecified: Secondary | ICD-10-CM

## 2024-06-04 NOTE — Progress Notes (Signed)
 Pt insurance needs referral for routine chiropractic adjustment.

## 2024-06-08 ENCOUNTER — Ambulatory Visit: Admitting: Family Medicine

## 2024-06-08 VITALS — BP 121/89 | HR 71 | Resp 16 | Ht 63.0 in | Wt 215.2 lb

## 2024-06-08 DIAGNOSIS — Z7689 Persons encountering health services in other specified circumstances: Secondary | ICD-10-CM

## 2024-06-08 DIAGNOSIS — G44209 Tension-type headache, unspecified, not intractable: Secondary | ICD-10-CM | POA: Diagnosis not present

## 2024-06-08 DIAGNOSIS — F32A Depression, unspecified: Secondary | ICD-10-CM

## 2024-06-08 DIAGNOSIS — F419 Anxiety disorder, unspecified: Secondary | ICD-10-CM | POA: Diagnosis not present

## 2024-06-08 DIAGNOSIS — R590 Localized enlarged lymph nodes: Secondary | ICD-10-CM | POA: Insufficient documentation

## 2024-06-08 MED ORDER — BUTALBITAL-APAP-CAFFEINE 50-325-40 MG PO TABS: 1.0000 | ORAL_TABLET | Freq: Four times a day (QID) | ORAL | 0 refills | Status: AC | PRN

## 2024-06-08 NOTE — Progress Notes (Signed)
 "  New Patient Office Visit  Subjective   Patient ID: Kim Schmidt, female    DOB: 04/18/1976  Age: 49 y.o. MRN: [id number]  CC:  Chief Complaint  Patient presents with   Establish Care    Lump Left side skull behind ear x 1 year bigger in last month causing pain with moving and headache. Disucssed weight loss and will come back for that knows Insurance wont cover meds.    Discussed the use of AI scribe software for clinical note transcription with the patient, who gave verbal consent to proceed.  History of Present Illness   Kim Schmidt is a 49 year old female who presents to establish with [facility] at [facility]. Her past medical history includes anxiety, depression, constipation, abdominal and hiatal hernias, acid reflux, high cholesterol, and migraines. She is currently taking Paxil  for anxiety and depression, which she feels controls her mood well. She also uses dermatological creams and continues to see a dermatologist. She has concerns today with a lump on the left side of her neck and associated headaches.   She has noticed a lump on the left side of her neck for approximately a year or so. Initially small and intermittent, the lump has become more noticeable and painful over the past month, particularly when she turns her head. It is described as 'squishy' and non-tender to touch, with pain only occurring during head movement.  She experiences daily headaches or migraines at the base of her neck, which have been present for the past month. The pain radiates from the neck to her head when she turns her head. Over-the-counter medications like Tylenol  provide some relief, but ibuprofen  is ineffective. She has a history of migraines, which were more frequent before her hysterectomy in 2014. After her pregnancy in 2013, she was prescribed a combination medication that effectively managed her migraines.      06/08/2024    4:01 PM 04/11/2021    2:42 PM   GAD 7 : Generalized Anxiety Score  Nervous, Anxious, on Edge 1 0  Control/stop worrying 0 0  Worry too much - different things 0 0  Trouble relaxing 0 0  Restless 0 0  Easily annoyed or irritable 1 0  Afraid - awful might happen 0 0  Total GAD 7 Score 2 0  Anxiety Difficulty  Not difficult at all      06/08/2024    4:00 PM 04/11/2021    2:42 PM  PHQ9 SCORE ONLY  PHQ-9 Total Score 3 7      Data saved with a previous flowsheet row definition    Outpatient Encounter Medications as of 06/08/2024  Medication Sig   butalbital -acetaminophen -caffeine  (FIORICET) 50-325-40 MG tablet Take 1-2 tablets by mouth every 6 (six) hours as needed for headache (Max dose of 6 tablets in 24 hours).   Crisaborole  (EUCRISA ) 2 % OINT Apply 1 Application topically daily. Qd to eczema on hands, legs   PARoxetine  (PAXIL ) 20 MG tablet TAKE 1/2 TABLET DAILY FOR 6 DAYS, THEN 1 TABLET BY MOUTH EVERY DAY   triamcinolone  cream (KENALOG ) 0.1 % Apply 1 Application topically as directed. Qd up to 3 days a week to aa eczema on hands/legs until clear, then prn flares, avoid face, groin, axilla   [DISCONTINUED] benzonatate  (TESSALON ) 100 MG capsule Take 2 capsules (200 mg total) by mouth every 8 (eight) hours.   [DISCONTINUED] ipratropium (ATROVENT ) 0.06 % nasal spray Place 2 sprays into both nostrils 4 (four) times daily.   [  DISCONTINUED] promethazine -dextromethorphan (PROMETHAZINE -DM) 6.25-15 MG/5ML syrup Take 5 mLs by mouth 4 (four) times daily as needed.   No facility-administered encounter medications on file as of 06/08/2024.    Patient Active Problem List   Diagnosis Date Noted   Pap smear abnormality of cervix with LGSIL 04/11/2021   BRCA negative 03/22/2019   Obesity (BMI 35.0-39.9 without comorbidity) 07/06/2018   Family history of ovarian cancer 09/03/2017   Anxiety and depression 09/03/2017   Galactorrhea 09/03/2017   Perimenopausal vasomotor symptoms 09/03/2017   Past Medical History:  Diagnosis Date    Abdominal hernia    Anxiety    Constipation 08/15/2014   Depression    Dysplastic nevus 09/25/2020   L upper back paraspinal - moderate   Dysplastic nevus 07/31/2023   left lateral distal deltoid - moderate to severe atypia - may need additional treatment  will recheck at 6 month follow   Esophageal reflux 08/15/2014   Family history of ovarian cancer 04/21/2012   MGM; Mom is BRCA/BART neg.   Genetic testing of female 01/2016   MYRISK neg   Hiatal hernia 08/15/2014   History of Papanicolaou smear of cervix 04/21/2012   -/-   Hyperlipidemia 04/21/2012   LGSIL on Pap smear of cervix 02/2019   Migraine    menstrual   Past Surgical History:  Procedure Laterality Date   ABDOMINAL HYSTERECTOMY  05/14/2013   AMS; RSO; LT salpingectomy   CESAREAN SECTION  11/12/2011   twins; PJR   COLPOSCOPY  2004   PJR   KNEE ARTHROSCOPY     RIGHT   TONSILLECTOMY AND ADENOIDECTOMY     TUBAL LIGATION     Family History  Problem Relation Age of Onset   Hypertension Mother    Cancer Father 2       MELANOMA   Cancer Maternal Grandmother 64       OVARY   Breast cancer Paternal Grandmother 40       BRAIN     Outpatient Medications Prior to Visit  Medication Sig Dispense Refill   Crisaborole  (EUCRISA ) 2 % OINT Apply 1 Application topically daily. Qd to eczema on hands, legs 100 g 5   PARoxetine  (PAXIL ) 20 MG tablet TAKE 1/2 TABLET DAILY FOR 6 DAYS, THEN 1 TABLET BY MOUTH EVERY DAY 90 tablet 3   triamcinolone  cream (KENALOG ) 0.1 % Apply 1 Application topically as directed. Qd up to 3 days a week to aa eczema on hands/legs until clear, then prn flares, avoid face, groin, axilla 80 g 6   benzonatate  (TESSALON ) 100 MG capsule Take 2 capsules (200 mg total) by mouth every 8 (eight) hours. 21 capsule 0   ipratropium (ATROVENT ) 0.06 % nasal spray Place 2 sprays into both nostrils 4 (four) times daily. 15 mL 12   promethazine -dextromethorphan (PROMETHAZINE -DM) 6.25-15 MG/5ML syrup Take 5 mLs by mouth  4 (four) times daily as needed. 118 mL 0   No facility-administered medications prior to visit.   Allergies[1]  ROS: see HPI    Objective   Today's Vitals   06/08/24 1549  BP: 121/89  Pulse: 71  Resp: 16  SpO2: 98%  Weight: 215 lb 3.2 oz (97.6 kg)  Height: 5' 3 (1.6 m)  PainSc: 0-No pain   Physical Exam Vitals reviewed.  Constitutional:      Appearance: Normal appearance.  Cardiovascular:     Rate and Rhythm: Normal rate and regular rhythm.     Pulses: Normal pulses.     Heart sounds: Normal heart  sounds.  Pulmonary:     Effort: Pulmonary effort is normal.     Breath sounds: Normal breath sounds.  Lymphadenopathy:     Head:     Right side of head: No submental, submandibular, tonsillar, preauricular, posterior auricular or occipital adenopathy.     Left side of head: Occipital adenopathy present. No submental, submandibular, tonsillar, preauricular or posterior auricular adenopathy.  Neurological:     Mental Status: She is alert.  Psychiatric:        Mood and Affect: Mood normal.        Behavior: Behavior normal.      Assessment & Plan:   1. Encounter to establish care (Primary) Patient is a 64- year-old female who presents today to establish care with primary care at [facility]. Reviewed the past medical history, family history, social history, surgical history, medications and allergies today- updates made as indicated. Patient has concerns today about the following:   2. Tension headache Chronic migraines with recent exacerbation, partially relieved by acetaminophen . Prescribed Fioricet medication for tension migraine relief, 1-2 tablets every 6 hours as needed, max 6 tablets/24 hours. Scheduled follow-up in 4 weeks to assess migraine control and discuss weight loss options. - butalbital -acetaminophen -caffeine  (FIORICET) 50-325-40 MG tablet; Take 1-2 tablets by mouth every 6 (six) hours as needed for headache (Max dose of 6 tablets in 24 hours).  Dispense: 30  tablet; Refill: 0  3. Anxiety and depression GAD7 completed with score of 2. Denies issues with panic attacks, shortness of breath, difficulty breathing, palpitations, hyperventilation, and dizziness. PHQ9 completed with score of 3. Denies active or passive suicidal ideations.  Well-controlled on current regimen of Paxil  20mg  daily. No refills needed today.   4. Enlarged lymph node in neck Lump at the base of the neck for a year or so, associated with headaches. Denies pain and tenderness to palpation. Ordered ultrasound of the neck to evaluate the lymph node. Consider biopsy if ultrasound findings are concerning. - US  Soft Tissue Head/Neck (NON-THYROID); Future   Return in about 4 weeks (around 07/06/2024) for migraine f/u & discuss weight loss .   Evalene Arts, FNP     [1]  Allergies Allergen Reactions   Sulfa  Antibiotics Swelling   "

## 2024-06-08 NOTE — Patient Instructions (Signed)

## 2024-06-10 ENCOUNTER — Ambulatory Visit
Admission: RE | Admit: 2024-06-10 | Discharge: 2024-06-10 | Disposition: A | Discharge status: Home / Self Care | Source: Ambulatory Visit | Attending: Family Medicine | Admitting: Family Medicine

## 2024-06-10 DIAGNOSIS — R590 Localized enlarged lymph nodes: Secondary | ICD-10-CM | POA: Insufficient documentation

## 2024-06-14 ENCOUNTER — Ambulatory Visit: Payer: Self-pay | Admitting: Family Medicine

## 2024-07-12 ENCOUNTER — Ambulatory Visit: Admitting: Family Medicine

## 2024-08-05 ENCOUNTER — Ambulatory Visit: Admitting: Dermatology

## 2024-08-05 ENCOUNTER — Ambulatory Visit: Payer: Medicaid Other | Admitting: Dermatology
